# Patient Record
Sex: Male | Born: 1955 | Race: White | Hispanic: No | Smoking: Never smoker
Health system: Southern US, Community
[De-identification: ages and names within clinical notes are randomized; demographics above are authoritative.]

## PROBLEM LIST (undated history)

## (undated) DIAGNOSIS — E039 Hypothyroidism, unspecified: Secondary | ICD-10-CM

## (undated) DIAGNOSIS — R569 Unspecified convulsions: Secondary | ICD-10-CM

## (undated) DIAGNOSIS — I10 Essential (primary) hypertension: Secondary | ICD-10-CM

## (undated) DIAGNOSIS — C801 Malignant (primary) neoplasm, unspecified: Secondary | ICD-10-CM

## (undated) DIAGNOSIS — J45909 Unspecified asthma, uncomplicated: Secondary | ICD-10-CM

## (undated) DIAGNOSIS — M1990 Unspecified osteoarthritis, unspecified site: Secondary | ICD-10-CM

## (undated) DIAGNOSIS — E119 Type 2 diabetes mellitus without complications: Secondary | ICD-10-CM

## (undated) HISTORY — PX: BRAIN SURGERY: SHX531

## (undated) HISTORY — DX: Type 2 diabetes mellitus without complications: E11.9

---

## 1998-05-19 ENCOUNTER — Encounter: Payer: Self-pay | Admitting: Neurosurgery

## 1998-05-19 ENCOUNTER — Ambulatory Visit (HOSPITAL_COMMUNITY): Admission: RE | Admit: 1998-05-19 | Discharge: 1998-05-19 | Payer: Self-pay | Admitting: Neurosurgery

## 2000-04-02 ENCOUNTER — Emergency Department (HOSPITAL_COMMUNITY): Admission: EM | Admit: 2000-04-02 | Discharge: 2000-04-02 | Payer: Self-pay | Admitting: Emergency Medicine

## 2002-10-21 ENCOUNTER — Emergency Department (HOSPITAL_COMMUNITY): Admission: EM | Admit: 2002-10-21 | Discharge: 2002-10-22 | Payer: Self-pay | Admitting: *Deleted

## 2003-06-25 ENCOUNTER — Encounter: Admission: RE | Admit: 2003-06-25 | Discharge: 2003-09-23 | Payer: Self-pay | Admitting: *Deleted

## 2003-09-24 ENCOUNTER — Encounter: Admission: RE | Admit: 2003-09-24 | Discharge: 2003-09-29 | Payer: Self-pay | Admitting: *Deleted

## 2011-09-20 ENCOUNTER — Encounter (HOSPITAL_COMMUNITY): Payer: Self-pay | Admitting: *Deleted

## 2011-09-20 ENCOUNTER — Emergency Department (HOSPITAL_COMMUNITY)
Admission: EM | Admit: 2011-09-20 | Discharge: 2011-09-20 | Disposition: A | Discharge status: Home / Self Care | Payer: Medicare Other | Attending: Emergency Medicine | Admitting: Emergency Medicine

## 2011-09-20 DIAGNOSIS — S01119A Laceration without foreign body of unspecified eyelid and periocular area, initial encounter: Secondary | ICD-10-CM | POA: Insufficient documentation

## 2011-09-20 DIAGNOSIS — S01111A Laceration without foreign body of right eyelid and periocular area, initial encounter: Secondary | ICD-10-CM

## 2011-09-20 DIAGNOSIS — I10 Essential (primary) hypertension: Secondary | ICD-10-CM | POA: Insufficient documentation

## 2011-09-20 DIAGNOSIS — S0501XA Injury of conjunctiva and corneal abrasion without foreign body, right eye, initial encounter: Secondary | ICD-10-CM

## 2011-09-20 DIAGNOSIS — S058X9A Other injuries of unspecified eye and orbit, initial encounter: Secondary | ICD-10-CM | POA: Insufficient documentation

## 2011-09-20 DIAGNOSIS — IMO0002 Reserved for concepts with insufficient information to code with codable children: Secondary | ICD-10-CM | POA: Insufficient documentation

## 2011-09-20 HISTORY — DX: Unspecified convulsions: R56.9

## 2011-09-20 HISTORY — DX: Essential (primary) hypertension: I10

## 2011-09-20 MED ORDER — OFLOXACIN 0.3 % OP SOLN
1.0000 [drp] | Freq: Four times a day (QID) | OPHTHALMIC | Status: DC
  Filled 2011-09-20: qty 5

## 2011-09-20 MED ORDER — PROPARACAINE HCL 0.5 % OP SOLN
1.0000 [drp] | OPHTHALMIC | Status: DC
  Filled 2011-09-20: qty 15

## 2011-09-20 MED ORDER — FLUORESCEIN SODIUM 1 MG OP STRP
1.0000 | ORAL_STRIP | Freq: Once | OPHTHALMIC | Status: AC
  Administered 2011-09-20: 1 via OPHTHALMIC
  Filled 2011-09-20: qty 1

## 2011-09-20 MED ORDER — TETANUS-DIPHTH-ACELL PERTUSSIS 5-2.5-18.5 LF-MCG/0.5 IM SUSP
0.5000 mL | Freq: Once | INTRAMUSCULAR | Status: AC
  Administered 2011-09-20: 0.5 mL via INTRAMUSCULAR
  Filled 2011-09-20: qty 0.5
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
## 2011-09-20 NOTE — ED Notes (Signed)
Called Pharmacy for eye drops. Informed that it should be sent shortly.

## 2011-09-20 NOTE — Discharge Instructions (Signed)
Corneal Abrasion The cornea is the clear covering at the front and center of the eye. It is a thin tissue made up of layers. The top layer is the most sensitive layer. A corneal abrasion happens if this layer is scratched or an injury causes it to come off.  HOME CARE  You may be given drops or a medicated cream. Use the medicine as told by your doctor.   A pressure patch may be put over the eye. If this is done, follow your doctor's instructions for when to remove the patch. Do not drive or use machines while the eye patch is on. Judging distances is hard to do with a patch on.   See your doctor for a follow-up exam if you are told to do so.  GET HELP RIGHT AWAY IF:   The pain is getting worse or is very bad.   The eye is very sensitive to light.   Any liquid comes out of the injured eye after treatment.   Your vision suddenly gets worse.   You have a sudden loss of vision or blindness.  MAKE SURE YOU:   Understand these instructions.   Will watch your condition.   Will get help right away if you are not doing well or get worse.  Document Released: 12/27/2007 Document Revised: 03/22/2011 Document Reviewed: 12/27/2007 [facility] Patient Information 2012 [place], [place].       Tissue Adhesive Wound Care A wound can be repaired by using tissue adhesive. Tissue adhesive holds the skin together and allows faster healing. It forms a strong bond on the skin in about 1 minute and reaches its full strength in about 2 or 3 minutes. The adhesive disappears naturally while healing. Follow up is required if your caregiver wants to recheck for infection and to make sure your wound is healing properly.  You may need a tetanus shot if:  You cannot remember when you had your last tetanus shot.   You have never had a tetanus shot.   The injury broke your skin.  If you got a tetanus shot, your arm may swell, get red, and feel warm to the touch. This is common and not a problem. If you need a  tetanus shot and you choose not to have one, there is a rare chance of getting tetanus. Sickness from tetanus can be serious. HOME CARE INSTRUCTIONS   Only take over-the-counter or prescription medicines for pain, discomfort, or fever as directed by your caregiver.   Showers are allowed. Do not soak the area containing the tissue adhesive. Do not take baths, swim, or use hot tubs. Do not use any soaps or ointments on the wound until it has healed.   If a bandage (dressing) has been applied, follow your caregiver's instructions for how often to change the dressing.   Keep the dressing dry if one has been applied.   Do not scratch, pick, or rub the adhesive.   Do not place tape over the adhesive. The adhesive could come off when pulling the tape off.   Protect the wound from further injury until it is healed.   Protect the wound from sun and tanning bed exposure while it is healing and for several weeks after healing.   Keep all follow-up appointments as directed by your caregiver.  SEEK IMMEDIATE MEDICAL CARE IF:   Your wound becomes red, swollen, hot, or tender.   You have increasing pain in the wound.   You have a red streak that  goes away from the wound.   You have pus coming from the wound.   You have a fever.   You have shaking chills.   There is a bad smell coming from the wound.   The wound or adhesive breaks open.  MAKE SURE YOU:   Understand these instructions.   Will watch your condition.   Will get help right away if you are not doing well or get worse.  Document Released: 01/03/2001 Document Revised: 03/23/2011 Document Reviewed: 11/13/2010 [facility] Patient Information 2012 [place], [place].        Eye Patch There are many reasons for you to wear an eye patch, and different eye patches for each reason. PROTECTION If your eye has been injured or has undergone surgery:   Your eye may be vulnerable to infection or greater injury, until it heals.    After surgery, your doctor may want you to wear an eye patch, to prevent your eye from getting infected or wet, which increases the chance of infection.   After surgery, if your eye needed stitches (sutures) to close an incision, a patch may be needed to prevent infection. A patch also prevents the possibility that the sutures might come apart, from something touching or rubbing the eye.  Do not drive or operate machinery while wearing a patch. Remember that having one eye covered eliminates your depth perception and your ability to judge distances. TYPES OF PATCHES Hard shell patches: Many eye specialists like to be extra careful, and will have you use a hard shell covering over a patch. Or they will have you use the hard shell by itself, over your eye, if they feel it is safe for your eye to be open. This type of patch adds extra protection from any blow to the eye area. Pressure patches: A pressure patch is used in specific situations. For example, it is used when the doctor wants the surface of the clear covering at the front of the eye (cornea), to heal rapidly. The patch stops any interference from the normal blinking of the eye. The pressure patch prevents blinking, allowing the cornea surface to heal.  A pressure patch is usually thick, and taped in a special way to your cheek and forehead, so that constant pressure is applied to your eye. It must be put on properly, to avoid too much pressure. Too much pressure can damage the eye. Too little pressure allows the eyelid to move under the patch. Cosmetic patches: People may use patches for cosmetic reasons, to hide an unsightly or absent eye. SEEK IMMEDIATE MEDICAL CARE IF:  You have increased eye pain.   You develop a discharge from your eye, that is clear and watery or thick in consistency.   Your pressure patch loosens up, and needs to be replaced.  Document Released: 04/01/2004 Document Revised: 03/22/2011 Document Reviewed:  05/28/2009 [facility] Patient Information 2012 [place], [place].

## 2011-09-20 NOTE — ED Provider Notes (Signed)
See prior note   Ward Givens, MD 09/20/11 4098
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
## 2012-08-14 ENCOUNTER — Emergency Department (HOSPITAL_COMMUNITY): Payer: Medicare Other

## 2012-08-14 ENCOUNTER — Encounter (HOSPITAL_COMMUNITY): Payer: Self-pay | Admitting: *Deleted

## 2012-08-14 ENCOUNTER — Emergency Department (HOSPITAL_COMMUNITY)
Admission: EM | Admit: 2012-08-14 | Discharge: 2012-08-14 | Disposition: A | Discharge status: Home / Self Care | Payer: Medicare Other | Attending: Emergency Medicine | Admitting: Emergency Medicine

## 2012-08-14 DIAGNOSIS — T182XXA Foreign body in stomach, initial encounter: Secondary | ICD-10-CM | POA: Insufficient documentation

## 2012-08-14 DIAGNOSIS — G40909 Epilepsy, unspecified, not intractable, without status epilepticus: Secondary | ICD-10-CM | POA: Insufficient documentation

## 2012-08-14 DIAGNOSIS — IMO0002 Reserved for concepts with insufficient information to code with codable children: Secondary | ICD-10-CM | POA: Insufficient documentation

## 2012-08-14 DIAGNOSIS — Y9289 Other specified places as the place of occurrence of the external cause: Secondary | ICD-10-CM | POA: Insufficient documentation

## 2012-08-14 DIAGNOSIS — I10 Essential (primary) hypertension: Secondary | ICD-10-CM | POA: Insufficient documentation

## 2012-08-14 DIAGNOSIS — Y9389 Activity, other specified: Secondary | ICD-10-CM | POA: Insufficient documentation

## 2012-08-14 DIAGNOSIS — Z79899 Other long term (current) drug therapy: Secondary | ICD-10-CM | POA: Insufficient documentation

## 2012-08-14 DIAGNOSIS — J029 Acute pharyngitis, unspecified: Secondary | ICD-10-CM | POA: Insufficient documentation

## 2012-08-14 NOTE — ED Provider Notes (Signed)
History     CSN: [id number]  Arrival date & time 08/14/12  2039   First MD Initiated Contact with Patient 08/14/12 2053      Chief Complaint  Patient presents with  . Swallowed Foreign Body    (Consider location/radiation/quality/duration/timing/severity/associated sxs/prior treatment) HPI Comments: George Rojas is a 57 year old male patient with no past pertinent medical history who presents to the emergency department complaining of possibly swallowing a metal christmas ornament hook this evening.  He states he was watching TV and had poured some cheetos into a bowl and was eating them when he noticed he had a metal ornament hook in his mouth.  He pulled the hook out of his mouth and at this time his wife stated she thinks there were a couple of the hooks in the bowl.  He is unsure if he swallowed one of them.  Associated symptoms include minor pain on the right side of his throat.  He denies chest pain, neck pain, shortness of breath, lightheadedness, dizziness, coughing, difficulty swallowing, nausea, vomiting, diarrhea and abdominal pain.    Patient is a 57 y.o. male presenting with foreign body swallowed.  Swallowed Foreign Body Associated symptoms include a sore throat. Pertinent negatives include no abdominal pain, chest pain, chills, coughing, fever, nausea, neck pain or vomiting.    Past Medical History  Diagnosis Date  . Hypertension   . Seizures     Past Surgical History  Procedure Date  . Brain surgery     History reviewed. No pertinent family history.  History  Substance Use Topics  . Smoking status: Not on file  . Smokeless tobacco: Not on file  . Alcohol Use: No      Review of Systems  Constitutional: Negative for fever and chills.  HENT: Positive for sore throat. Negative for drooling, mouth sores, trouble swallowing, neck pain and voice change.   Respiratory: Negative for cough, choking, chest tightness and shortness of breath.   Cardiovascular:  Negative for chest pain.  Gastrointestinal: Negative for nausea, vomiting, abdominal pain and diarrhea.  Neurological: Negative for dizziness, syncope and light-headedness.  All other systems reviewed and are negative.    Allergies  Phenobarbital and Prednisone  Home Medications   Current Outpatient Rx  Name  Route  Sig  Dispense  Refill  . CARBAMAZEPINE 200 MG PO TABS   Oral   Take 200 mg by mouth 3 (three) times daily.         . ENALAPRIL MALEATE 20 MG PO TABS   Oral   Take 20 mg by mouth daily.         Marland Kitchen HYDROCHLOROTHIAZIDE 25 MG PO TABS   Oral   Take 25 mg by mouth daily.         Marland Kitchen LAMOTRIGINE 200 MG PO TABS   Oral   Take 200-400 mg by mouth 2 (two) times daily. One tablet in the am, and 2 tablets in the pm.           BP 159/97  Pulse 96  Temp 98.1 F (36.7 C) (Oral)  Resp 16  SpO2 96%  Physical Exam  Constitutional: He is oriented to person, place, and time. He appears well-developed and well-nourished. No distress.  HENT:  Head: Normocephalic and atraumatic.  Mouth/Throat: Oropharynx is clear and moist. No oropharyngeal exudate.       No pharyngeal erythema noted.    Cardiovascular: Normal rate, regular rhythm and normal heart sounds.   Pulmonary/Chest: Effort normal and  breath sounds normal. No respiratory distress. He has no wheezes. He has no rales. He exhibits no tenderness.  Abdominal: Soft. There is no tenderness. There is no rebound and no guarding.  Neurological: He is alert and oriented to person, place, and time.  Skin: Skin is warm and dry. He is not diaphoretic.    ED Course  Procedures (including critical care time)  Labs Reviewed - No data to display Dg Chest 1 View  08/14/2012  *RADIOLOGY REPORT*  Clinical Data: The patient swallowed foreign body.  CHEST - 1 VIEW  Comparison: None.  Findings: Leads from prior stimulator device are seen over the left chest.  No unexpected radiopaque foreign body is identified.  Lungs are clear.   Heart size is normal.  No pneumothorax or pleural fluid.  IMPRESSION: Negative for foreign body.   Original Report Authenticated By: Holley Dexter, M.D.    Dg Abd 1 View  08/14/2012  *RADIOLOGY REPORT*  Clinical Data: The patient swallowed a foreign body.  ABDOMEN - 1 VIEW  Comparison: None.  Findings: There is a radiopaque foreign body projecting over the distal aspect of the stomach.  The examination is otherwise negative.  IMPRESSION: Radiopaque foreign body projects over the distal stomach.   Original Report Authenticated By: Holley Dexter, M.D.      1. Foreign body in stomach       MDM  Will obtain 1 view chest and abdomen to include neck   ornament visualized in the stomach       Arman Filter, NP 08/14/12 2147

## 2012-08-14 NOTE — ED Notes (Addendum)
Was eating chips from a bowl. Was informed that there were christmas tree ornament hangers/hooks (similar to a paper clip) in the bowl. Now not sure if he swallowed one of them. States, "not sure whether I am just paranoid and anxious". Alert, NAD, calm, interactive, resps e/u, speech clear, speaking in clear complete sentences. LS CTA. Throat unremarkable. Oral cavity clear, no obvious injury or blood. No coughing noted. Denies sx.

## 2012-08-14 NOTE — ED Notes (Signed)
Patient transported to X-ray 

## 2012-08-18 NOTE — ED Provider Notes (Signed)
Medical screening examination/treatment/procedure(s) were performed by non-physician practitioner and as supervising physician I was immediately available for consultation/collaboration.  Tobin Chad, MD 08/18/12 [phone]

## 2014-08-04 ENCOUNTER — Encounter: Payer: Medicare Other | Attending: Family Medicine

## 2014-08-04 VITALS — Ht 71.0 in | Wt 215.8 lb

## 2014-08-04 DIAGNOSIS — IMO0002 Reserved for concepts with insufficient information to code with codable children: Secondary | ICD-10-CM

## 2014-08-04 DIAGNOSIS — E118 Type 2 diabetes mellitus with unspecified complications: Secondary | ICD-10-CM | POA: Diagnosis not present

## 2014-08-04 DIAGNOSIS — Z713 Dietary counseling and surveillance: Secondary | ICD-10-CM | POA: Diagnosis not present

## 2014-08-04 DIAGNOSIS — E1165 Type 2 diabetes mellitus with hyperglycemia: Secondary | ICD-10-CM

## 2014-08-04 NOTE — Progress Notes (Signed)

## 2014-08-11 DIAGNOSIS — E118 Type 2 diabetes mellitus with unspecified complications: Secondary | ICD-10-CM | POA: Diagnosis not present

## 2014-08-11 DIAGNOSIS — E119 Type 2 diabetes mellitus without complications: Secondary | ICD-10-CM

## 2014-08-11 NOTE — Progress Notes (Signed)

## 2014-08-18 DIAGNOSIS — E119 Type 2 diabetes mellitus without complications: Secondary | ICD-10-CM

## 2014-08-18 DIAGNOSIS — E118 Type 2 diabetes mellitus with unspecified complications: Secondary | ICD-10-CM | POA: Diagnosis not present

## 2014-08-18 NOTE — Progress Notes (Signed)
Patient was seen on 08/18/14 for the third of a series of three diabetes self-management courses at the [facility]. The following learning objectives were met by the patient during this class:  . State the amount of activity recommended for healthy living . Describe activities suitable for individual needs . Identify ways to regularly incorporate activity into daily life . Identify barriers to activity and ways to over come these barriers  Identify diabetes medications being personally used and their primary action for lowering glucose and possible side effects . Describe role of stress on blood glucose and develop strategies to address psychosocial issues . Identify diabetes complications and ways to prevent them  Explain how to manage diabetes during illness . Evaluate success in meeting personal goal . Establish 2-3 goals that they will plan to diligently work on until they return for the  64-monthfollow-up visit  Goals:   I will count my carb choices at most meals and snacks  I will be more active   I will take my diabetes medications as scheduled  I will test my glucose at least 2 times a day, 5 days a week  To help manage stress I will call friends at least 2 times a week  Your patient has identified these potential barriers to change:  Lack of Family Support  Your patient has identified their diabetes self-care support plan as  [facility] Family Support Plan:  Attend Core 4 in 4 months

## 2015-04-27 ENCOUNTER — Encounter (HOSPITAL_COMMUNITY): Payer: Self-pay | Admitting: Emergency Medicine

## 2015-04-27 ENCOUNTER — Emergency Department (HOSPITAL_COMMUNITY)
Admission: EM | Admit: 2015-04-27 | Discharge: 2015-04-27 | Disposition: A | Discharge status: Home / Self Care | Payer: Medicare Other | Attending: Emergency Medicine | Admitting: Emergency Medicine

## 2015-04-27 ENCOUNTER — Emergency Department (HOSPITAL_COMMUNITY): Payer: Medicare Other

## 2015-04-27 DIAGNOSIS — Z79899 Other long term (current) drug therapy: Secondary | ICD-10-CM | POA: Insufficient documentation

## 2015-04-27 DIAGNOSIS — E119 Type 2 diabetes mellitus without complications: Secondary | ICD-10-CM | POA: Insufficient documentation

## 2015-04-27 DIAGNOSIS — J45901 Unspecified asthma with (acute) exacerbation: Secondary | ICD-10-CM | POA: Diagnosis not present

## 2015-04-27 DIAGNOSIS — R05 Cough: Secondary | ICD-10-CM | POA: Diagnosis present

## 2015-04-27 DIAGNOSIS — I10 Essential (primary) hypertension: Secondary | ICD-10-CM | POA: Diagnosis not present

## 2015-04-27 DIAGNOSIS — Z7982 Long term (current) use of aspirin: Secondary | ICD-10-CM | POA: Diagnosis not present

## 2015-04-27 HISTORY — DX: Unspecified asthma, uncomplicated: J45.909

## 2015-04-27 MED ORDER — ALBUTEROL SULFATE HFA 108 (90 BASE) MCG/ACT IN AERS: 1.0000 | INHALATION_SPRAY | Freq: Four times a day (QID) | RESPIRATORY_TRACT | Status: AC | PRN

## 2015-04-27 MED ORDER — DEXAMETHASONE SODIUM PHOSPHATE 10 MG/ML IJ SOLN
4.0000 mg | Freq: Once | INTRAMUSCULAR | Status: AC
  Administered 2015-04-27: 4 mg via INTRAMUSCULAR
  Filled 2015-04-27: qty 1

## 2015-04-27 MED ORDER — PREDNISONE 20 MG PO TABS: ORAL_TABLET | ORAL | Status: DC

## 2015-04-27 MED ORDER — IPRATROPIUM BROMIDE 0.02 % IN SOLN
0.5000 mg | Freq: Once | RESPIRATORY_TRACT | Status: AC
  Administered 2015-04-27: 0.5 mg via RESPIRATORY_TRACT
  Filled 2015-04-27: qty 2.5

## 2015-04-27 MED ORDER — ALBUTEROL SULFATE (2.5 MG/3ML) 0.083% IN NEBU
5.0000 mg | INHALATION_SOLUTION | Freq: Once | RESPIRATORY_TRACT | Status: AC
  Administered 2015-04-27: 5 mg via RESPIRATORY_TRACT
  Filled 2015-04-27: qty 6

## 2015-04-27 MED ORDER — LORATADINE 10 MG PO TABS
10.0000 mg | ORAL_TABLET | Freq: Once | ORAL | Status: AC
  Administered 2015-04-27: 10 mg via ORAL
  Filled 2015-04-27: qty 1

## 2015-04-27 MED ORDER — CETIRIZINE HCL 10 MG PO TABS: 10.0000 mg | ORAL_TABLET | Freq: Every day | ORAL | Status: DC

## 2015-04-27 NOTE — ED Notes (Signed)
Awaiting paperwork for discharge.

## 2015-04-27 NOTE — ED Provider Notes (Signed)
CSN: [id number]     Arrival date & time 04/27/15  0033 History  By signing my name below, I, Tanda Rockers, attest that this documentation has been prepared under the direction and in the presence of Handsome Anglin, MD. Electronically Signed: Tanda Rockers, ED Scribe. 04/27/2015. 12:56 AM.    Chief Complaint  Patient presents with  . Cough   Patient is a 59 y.o. male presenting with cough. The history is provided by the patient. No language interpreter was used.  Cough Cough characteristics:  Non-productive Severity:  Moderate Onset quality:  Gradual Duration:  5 days Timing:  Constant Progression:  Unchanged Chronicity:  Recurrent Smoker: no   Context: weather changes   Relieved by:  Nothing Worsened by:  Nothing tried Ineffective treatments:  Beta-agonist inhaler (Inhaler) Associated symptoms: wheezing   Associated symptoms: no chest pain, no chills, no fever, no shortness of breath and no sore throat      HPI Comments: George Rojas is a 59 y.o. male with hx asthma, HTN, and DM who presents to the Emergency Department complaining of gradual onset, intermittent, cough, wheezing, and congestion x 5 days. Pt has been using his inhaler without relief. Pt states these symptoms feel similar to his usual asthma exacerbation. He states that these symptoms usual occur when the weather changes. Denies fever, chills, or any other associated symptoms. Pt is non smoker.   Past Medical History  Diagnosis Date  . Hypertension   . Seizures (HCC)   . Diabetes mellitus without complication (HCC)   . Asthma    Past Surgical History  Procedure Laterality Date  . Brain surgery     No family history on file. Social History  Substance Use Topics  . Smoking status: Never Smoker   . Smokeless tobacco: None  . Alcohol Use: No    Review of Systems  Constitutional: Negative for fever and chills.  HENT: Positive for congestion. Negative for sore throat and voice change.   Respiratory:  Positive for cough and wheezing. Negative for shortness of breath.   Cardiovascular: Negative for chest pain.  All other systems reviewed and are negative.  Allergies  Phenobarbital and Prednisone  Home Medications   Prior to Admission medications   Medication Sig Start Date End Date Taking? Authorizing Provider  aspirin 81 MG tablet Take 81 mg by mouth daily.    Historical Provider, MD  atorvastatin (LIPITOR) 10 MG tablet Take 10 mg by mouth daily.    Historical Provider, MD  carbamazepine (TEGRETOL) 200 MG tablet Take 200 mg by mouth 3 (three) times daily.    Historical Provider, MD  enalapril (VASOTEC) 20 MG tablet Take 20 mg by mouth daily.    Historical Provider, MD  hydrochlorothiazide (HYDRODIURIL) 25 MG tablet Take 25 mg by mouth daily.    Historical Provider, MD  lamoTRIgine (LAMICTAL) 200 MG tablet Take 200-400 mg by mouth 2 (two) times daily. One tablet in the am, and 2 tablets in the pm.    Historical Provider, MD  LORazepam (ATIVAN) 0.5 MG tablet Take 0.5 mg by mouth every 8 (eight) hours as needed for anxiety.    Historical Provider, MD  metFORMIN (GLUCOPHAGE) 500 MG tablet Take by mouth 2 (two) times daily with a meal.    Historical Provider, MD  terazosin (HYTRIN) 1 MG capsule Take 1 mg by mouth at bedtime.    Historical Provider, MD   Triage VItals: BP 137/77 mmHg  Pulse 105  Temp(Src) 98.4 F (36.9 C) (Oral)  Resp 26  SpO2 93%   Physical Exam  Constitutional: He is oriented to person, place, and time. He appears well-developed and well-nourished. No distress.  HENT:  Head: Normocephalic and atraumatic.  Mouth/Throat: Oropharynx is clear and moist and mucous membranes are normal. No oropharyngeal exudate.  No swelling of the lips, tongue, or uvula  Eyes: Conjunctivae and EOM are normal. Pupils are equal, round, and reactive to light.  Neck: Normal range of motion. Neck supple. No tracheal deviation present.  Trachea is midline no pain with displacement of the  trachea  Cardiovascular: Normal rate and intact distal pulses.   Pulmonary/Chest: Effort normal. No stridor. No respiratory distress. He has wheezes. He has no rales.  Decreased breath sounds  Abdominal: Soft. Bowel sounds are normal. He exhibits no mass. There is no tenderness. There is no rebound and no guarding.  Musculoskeletal: Normal range of motion.  Neurological: He is alert and oriented to person, place, and time.  Skin: Skin is warm and dry.  Psychiatric: He has a normal mood and affect. His behavior is normal.  Nursing note and vitals reviewed.   ED Course  Procedures (including critical care time)  DIAGNOSTIC STUDIES: Oxygen Saturation is 93% on RA, low by my interpretation.    COORDINATION OF CARE: 12:54 AM-Discussed treatment plan which includes breathing treatment with pt at bedside and pt agreed to plan.   Labs Review Labs Reviewed - No data to display  Imaging Review No results found. I have personally reviewed and evaluated these images and lab results as part of my medical decision-making.   EKG Interpretation None      MDM   Final diagnoses:  None   Medications  albuterol (PROVENTIL) (2.5 MG/3ML) 0.083% nebulizer solution 5 mg (5 mg Nebulization Given 04/27/15 0057)  ipratropium (ATROVENT) nebulizer solution 0.5 mg (0.5 mg Nebulization Given 04/27/15 0057)  dexamethasone (DECADRON) injection 4 mg (4 mg Intramuscular Given 04/27/15 0108)  loratadine (CLARITIN) tablet 10 mg (10 mg Oral Given 04/27/15 0108)  albuterol (PROVENTIL) (2.5 MG/3ML) 0.083% nebulizer solution 5 mg (5 mg Nebulization Given 04/27/15 0252)   Clear post second nebulizer treatment, normal O2 saturation.  Will start steroids and have patient follow up with his pmd today.Return for any new or worsening symptoms  I, Amry Cathy-RASCH,Chancy Claros K, personally performed the services described in this documentation. All medical record entries made by the scribe were at my direction and in my presence.   I have reviewed the chart and discharge instructions and agree that the record reflects my personal performance and is accurate and complete. Cherylyn Sundby-RASCH,Airabella Barley K.  04/27/2015. 3:19 AM.       Ayaansh Smail, MD 04/27/15 1610

## 2015-04-27 NOTE — ED Notes (Signed)
Pt states he started getting sick on Thursday with congestion and cough  Pt states it has been getting worse since  Pt has hx of asthma  Pt states has used his inhaler without relief  Dry hacking cough noted in triage with audible wheezing noted

## 2015-04-27 NOTE — ED Notes (Signed)
No adverse reaction to decadron at this time

## 2015-04-27 NOTE — ED Notes (Signed)
MD at bedside at this time.

## 2015-04-27 NOTE — ED Notes (Signed)
Pt given discharge instructions and verbalized understanding of need to follow up, reasons to return to the ED and medications to take at home. Pt denies pain. VSS. Pt requested to speak to MD about questions. Dr. Nicanor Alcon has spoken to pt. Pt ambulated to exit, moving all extremities, without difficulty.

## 2015-04-27 NOTE — ED Notes (Signed)
Pt adds he has been running a fever today

## 2015-04-27 NOTE — Discharge Instructions (Signed)

## 2015-09-16 ENCOUNTER — Emergency Department (HOSPITAL_COMMUNITY): Payer: Medicare Other

## 2015-09-16 ENCOUNTER — Encounter (HOSPITAL_COMMUNITY): Payer: Self-pay

## 2015-09-16 ENCOUNTER — Emergency Department (HOSPITAL_COMMUNITY)
Admission: EM | Admit: 2015-09-16 | Discharge: 2015-09-16 | Disposition: A | Discharge status: Home / Self Care | Payer: Medicare Other | Attending: Emergency Medicine | Admitting: Emergency Medicine

## 2015-09-16 DIAGNOSIS — Z79899 Other long term (current) drug therapy: Secondary | ICD-10-CM | POA: Diagnosis not present

## 2015-09-16 DIAGNOSIS — J4520 Mild intermittent asthma, uncomplicated: Secondary | ICD-10-CM

## 2015-09-16 DIAGNOSIS — E119 Type 2 diabetes mellitus without complications: Secondary | ICD-10-CM | POA: Diagnosis not present

## 2015-09-16 DIAGNOSIS — Z7982 Long term (current) use of aspirin: Secondary | ICD-10-CM | POA: Diagnosis not present

## 2015-09-16 DIAGNOSIS — I10 Essential (primary) hypertension: Secondary | ICD-10-CM | POA: Diagnosis not present

## 2015-09-16 DIAGNOSIS — Z7984 Long term (current) use of oral hypoglycemic drugs: Secondary | ICD-10-CM | POA: Diagnosis not present

## 2015-09-16 DIAGNOSIS — J45909 Unspecified asthma, uncomplicated: Secondary | ICD-10-CM | POA: Diagnosis present

## 2015-09-16 LAB — COMPREHENSIVE METABOLIC PANEL
ALBUMIN: 4.5 g/dL (ref 3.5–5.0)
ALT: 25 U/L (ref 17–63)
AST: 24 U/L (ref 15–41)
Alkaline Phosphatase: 74 U/L (ref 38–126)
Anion gap: 14 (ref 5–15)
BUN: 15 mg/dL (ref 6–20)
CHLORIDE: 101 mmol/L (ref 101–111)
CO2: 24 mmol/L (ref 22–32)
CREATININE: 1.09 mg/dL (ref 0.61–1.24)
Calcium: 9.4 mg/dL (ref 8.9–10.3)
GFR calc Af Amer: >60 mL/min (ref >60)
GLUCOSE: 264 mg/dL — AB (ref 65–99)
POTASSIUM: 3.1 mmol/L — AB (ref 3.5–5.1)
Sodium: 139 mmol/L (ref 135–145)
Total Bilirubin: 0.6 mg/dL (ref 0.3–1.2)
Total Protein: 7.7 g/dL (ref 6.5–8.1)

## 2015-09-16 LAB — CBC WITH DIFFERENTIAL/PLATELET
BASOS ABS: 0 10*3/uL (ref 0.0–0.1)
BASOS PCT: 0 %
EOS PCT: 1 %
Eosinophils Absolute: 0.1 10*3/uL (ref 0.0–0.7)
HEMATOCRIT: 44.3 % (ref 39.0–52.0)
Hemoglobin: 15.1 g/dL (ref 13.0–17.0)
LYMPHS PCT: 19 %
Lymphs Abs: 1.3 10*3/uL (ref 0.7–4.0)
MCH: 31.2 pg (ref 26.0–34.0)
MCHC: 34.1 g/dL (ref 30.0–36.0)
MCV: 91.5 fL (ref 78.0–100.0)
Monocytes Absolute: 0.4 10*3/uL (ref 0.1–1.0)
Monocytes Relative: 6 %
NEUTROS ABS: 5 10*3/uL (ref 1.7–7.7)
Neutrophils Relative %: 74 %
PLATELETS: 225 10*3/uL (ref 150–400)
RBC: 4.84 MIL/uL (ref 4.22–5.81)
RDW: 13.2 % (ref 11.5–15.5)
WBC: 6.8 10*3/uL (ref 4.0–10.5)

## 2015-09-16 LAB — CBG MONITORING, ED: GLUCOSE-CAPILLARY: 224 mg/dL — AB (ref 65–99)

## 2015-09-16 MED ORDER — ALBUTEROL SULFATE (2.5 MG/3ML) 0.083% IN NEBU
5.0000 mg | INHALATION_SOLUTION | Freq: Once | RESPIRATORY_TRACT | Status: AC
  Administered 2015-09-16: 5 mg via RESPIRATORY_TRACT
  Filled 2015-09-16: qty 6

## 2015-09-16 MED ORDER — ALBUTEROL SULFATE (2.5 MG/3ML) 0.083% IN NEBU
2.5000 mg | INHALATION_SOLUTION | Freq: Once | RESPIRATORY_TRACT | Status: AC
  Administered 2015-09-16: 2.5 mg via RESPIRATORY_TRACT
  Filled 2015-09-16: qty 3

## 2015-09-16 MED ORDER — ALBUTEROL SULFATE (2.5 MG/3ML) 0.083% IN NEBU
INHALATION_SOLUTION | RESPIRATORY_TRACT | Status: AC
  Filled 2015-09-16: qty 6

## 2015-09-16 MED ORDER — IPRATROPIUM-ALBUTEROL 0.5-2.5 (3) MG/3ML IN SOLN
3.0000 mL | Freq: Once | RESPIRATORY_TRACT | Status: AC
  Administered 2015-09-16: 3 mL via RESPIRATORY_TRACT
  Filled 2015-09-16: qty 3

## 2015-09-16 MED ORDER — METHYLPREDNISOLONE SODIUM SUCC 125 MG IJ SOLR
125.0000 mg | Freq: Once | INTRAMUSCULAR | Status: AC
  Administered 2015-09-16: 125 mg via INTRAVENOUS
  Filled 2015-09-16: qty 2

## 2015-09-16 MED ORDER — SODIUM CHLORIDE 0.9 % IV BOLUS (SEPSIS)
500.0000 mL | Freq: Once | INTRAVENOUS | Status: AC
  Administered 2015-09-16: 500 mL via INTRAVENOUS

## 2015-09-16 MED ORDER — AZITHROMYCIN 250 MG PO TABS: ORAL_TABLET | ORAL | Status: DC

## 2015-09-16 NOTE — ED Notes (Signed)
RN to start IV, will obtain blood at that time

## 2015-09-16 NOTE — ED Notes (Signed)
Pt ambulated to the BR with steady gait.   

## 2015-09-16 NOTE — ED Provider Notes (Signed)
CSN: [id number]     Arrival date & time 09/16/15  7829 History   First MD Initiated Contact with Patient 09/16/15 0703     Chief Complaint  Patient presents with  . Asthma     (Consider location/radiation/quality/duration/timing/severity/associated sxs/prior Treatment) Patient is a 60 y.o. male presenting with asthma. The history is provided by the patient (Patient complains of shortness of breath and wheezing. He has been on prednisone recently).  Asthma This is a recurrent problem. The current episode started 3 to 5 hours ago. The problem occurs constantly. Pertinent negatives include no chest pain, no abdominal pain and no headaches. Nothing aggravates the symptoms. Nothing relieves the symptoms.    Past Medical History  Diagnosis Date  . Hypertension   . Seizures (HCC)   . Diabetes mellitus without complication (HCC)   . Asthma    Past Surgical History  Procedure Laterality Date  . Brain surgery     History reviewed. No pertinent family history. Social History  Substance Use Topics  . Smoking status: Never Smoker   . Smokeless tobacco: None  . Alcohol Use: No    Review of Systems  Constitutional: Negative for appetite change and fatigue.  HENT: Negative for congestion, ear discharge and sinus pressure.   Eyes: Negative for discharge.  Respiratory: Positive for wheezing. Negative for cough.   Cardiovascular: Negative for chest pain.  Gastrointestinal: Negative for abdominal pain and diarrhea.  Genitourinary: Negative for frequency and hematuria.  Musculoskeletal: Negative for back pain.  Skin: Negative for rash.  Neurological: Negative for seizures and headaches.  Psychiatric/Behavioral: Negative for hallucinations.      Allergies  Phenobarbital  Home Medications   Prior to Admission medications   Medication Sig Start Date End Date Taking? Authorizing Provider  albuterol (PROVENTIL HFA;VENTOLIN HFA) 108 (90 BASE) MCG/ACT inhaler Inhale 1-2 puffs into the  lungs every 6 (six) hours as needed for wheezing or shortness of breath. 04/27/15  Yes April Palumbo, MD  aspirin EC 81 MG tablet Take 81 mg by mouth at bedtime.    Yes Historical Provider, MD  atorvastatin (LIPITOR) 10 MG tablet Take 10 mg by mouth at bedtime.    Yes Historical Provider, MD  carbamazepine (TEGRETOL) 200 MG tablet Take 200 mg by mouth 2 (two) times daily.    Yes Historical Provider, MD  cetirizine (ZYRTEC ALLERGY) 10 MG tablet Take 1 tablet (10 mg total) by mouth daily. 04/27/15  Yes April Palumbo, MD  dextromethorphan-guaiFENesin [facility] DM) 30-600 MG 12hr tablet Take 2 tablets by mouth once.   Yes Historical Provider, MD  enalapril (VASOTEC) 20 MG tablet Take 20 mg by mouth daily with breakfast.    Yes Historical Provider, MD  hydrochlorothiazide (HYDRODIURIL) 25 MG tablet Take 25 mg by mouth daily with breakfast.    Yes Historical Provider, MD  lamoTRIgine (LAMICTAL) 200 MG tablet Take 200-400 mg by mouth 2 (two) times daily. One tablet in the am, and 2 tablets in the pm.   Yes Historical Provider, MD  LORazepam (ATIVAN) 0.5 MG tablet Take 0.5 mg by mouth every 8 (eight) hours as needed for anxiety.   Yes Historical Provider, MD  metFORMIN (GLUCOPHAGE) 500 MG tablet Take 500 mg by mouth 2 (two) times daily with a meal.    Yes Historical Provider, MD  predniSONE (DELTASONE) 20 MG tablet 3 tabs po day one, then 2 po daily x 4 days Patient taking differently: Take 40 mg by mouth every morning. Started 02/23 for 5 days 04/27/15  Yes April Palumbo, MD  terazosin (HYTRIN) 1 MG capsule Take 1 mg by mouth at bedtime.   Yes Historical Provider, MD  azithromycin (ZITHROMAX Z-PAK) 250 MG tablet 2 po day one, then 1 daily x 4 days 09/16/15   Bethann Berkshire, MD   BP 127/62 mmHg  Pulse 112  Temp(Src) 98.3 F (36.8 C) (Oral)  Resp 17  SpO2 96% Physical Exam  Constitutional: He is oriented to person, place, and time. He appears well-developed.  HENT:  Head: Normocephalic.  Eyes:  Conjunctivae and EOM are normal. No scleral icterus.  Neck: Neck supple. No thyromegaly present.  Cardiovascular: Normal rate and regular rhythm.  Exam reveals no gallop and no friction rub.   No murmur heard. Pulmonary/Chest: No stridor. He has wheezes. He has no rales. He exhibits no tenderness.  Abdominal: He exhibits no distension. There is no tenderness. There is no rebound.  Musculoskeletal: Normal range of motion. He exhibits no edema.  Lymphadenopathy:    He has no cervical adenopathy.  Neurological: He is oriented to person, place, and time. He exhibits normal muscle tone. Coordination normal.  Skin: No rash noted. No erythema.  Psychiatric: He has a normal mood and affect. His behavior is normal.    ED Course  Procedures (including critical care time) Labs Review Labs Reviewed  COMPREHENSIVE METABOLIC PANEL - Abnormal; Notable for the following:    Potassium 3.1 (*)    Glucose, Bld 264 (*)    All other components within normal limits  CBG MONITORING, ED - Abnormal; Notable for the following:    Glucose-Capillary 224 (*)    All other components within normal limits  CBC WITH DIFFERENTIAL/PLATELET    Imaging Review Dg Chest 2 View  09/16/2015  CLINICAL DATA:  Worsening cough.  Shortness of breath over 2-3 days. EXAM: CHEST  2 VIEW COMPARISON:  04/27/2015 FINDINGS: Both lungs are clear. Heart and mediastinum are within normal limits. The trachea is midline. Again noted are leads on the left side of the chest and neck. No acute bone abnormality. IMPRESSION: No active cardiopulmonary disease. Electronically Signed   By: Richarda Overlie M.D.   On: 09/16/2015 07:58   I have personally reviewed and evaluated these images and lab results as part of my medical decision-making.   EKG Interpretation None      MDM   Final diagnoses:  Asthma, mild intermittent, uncomplicated    Patient with asthmatic bronchitis. Presently on prednisone. Also albuterol inhaler but he only uses it  once a day. Patient was instructed to use the inhaler at least 4 times a day for the next couple days is also given a prescription for Z-Pak and he'll follow-up with his PCP    Bethann Berkshire, MD 09/16/15 1022

## 2015-09-16 NOTE — ED Notes (Signed)
Patient transported to X-ray 

## 2015-09-16 NOTE — ED Notes (Signed)
Pt complains of an asthma attack that started when he woke up this am, he was placed on prednisone this week in [facility] for a cough and his blood sugars have been high.

## 2015-09-16 NOTE — Discharge Instructions (Signed)
Follow up with your md next week or sooner

## 2016-05-15 ENCOUNTER — Ambulatory Visit: Payer: Medicare Other | Admitting: Family Medicine

## 2017-03-09 IMAGING — DX DG CHEST 1V PORT
1 series · 1 of 1 positions shown · non-contrast
Comparison: 08/14/2012

CLINICAL DATA: Cough congestion and dyspnea for 4 days.

EXAM:
PORTABLE CHEST 1 VIEW

[chest ap]
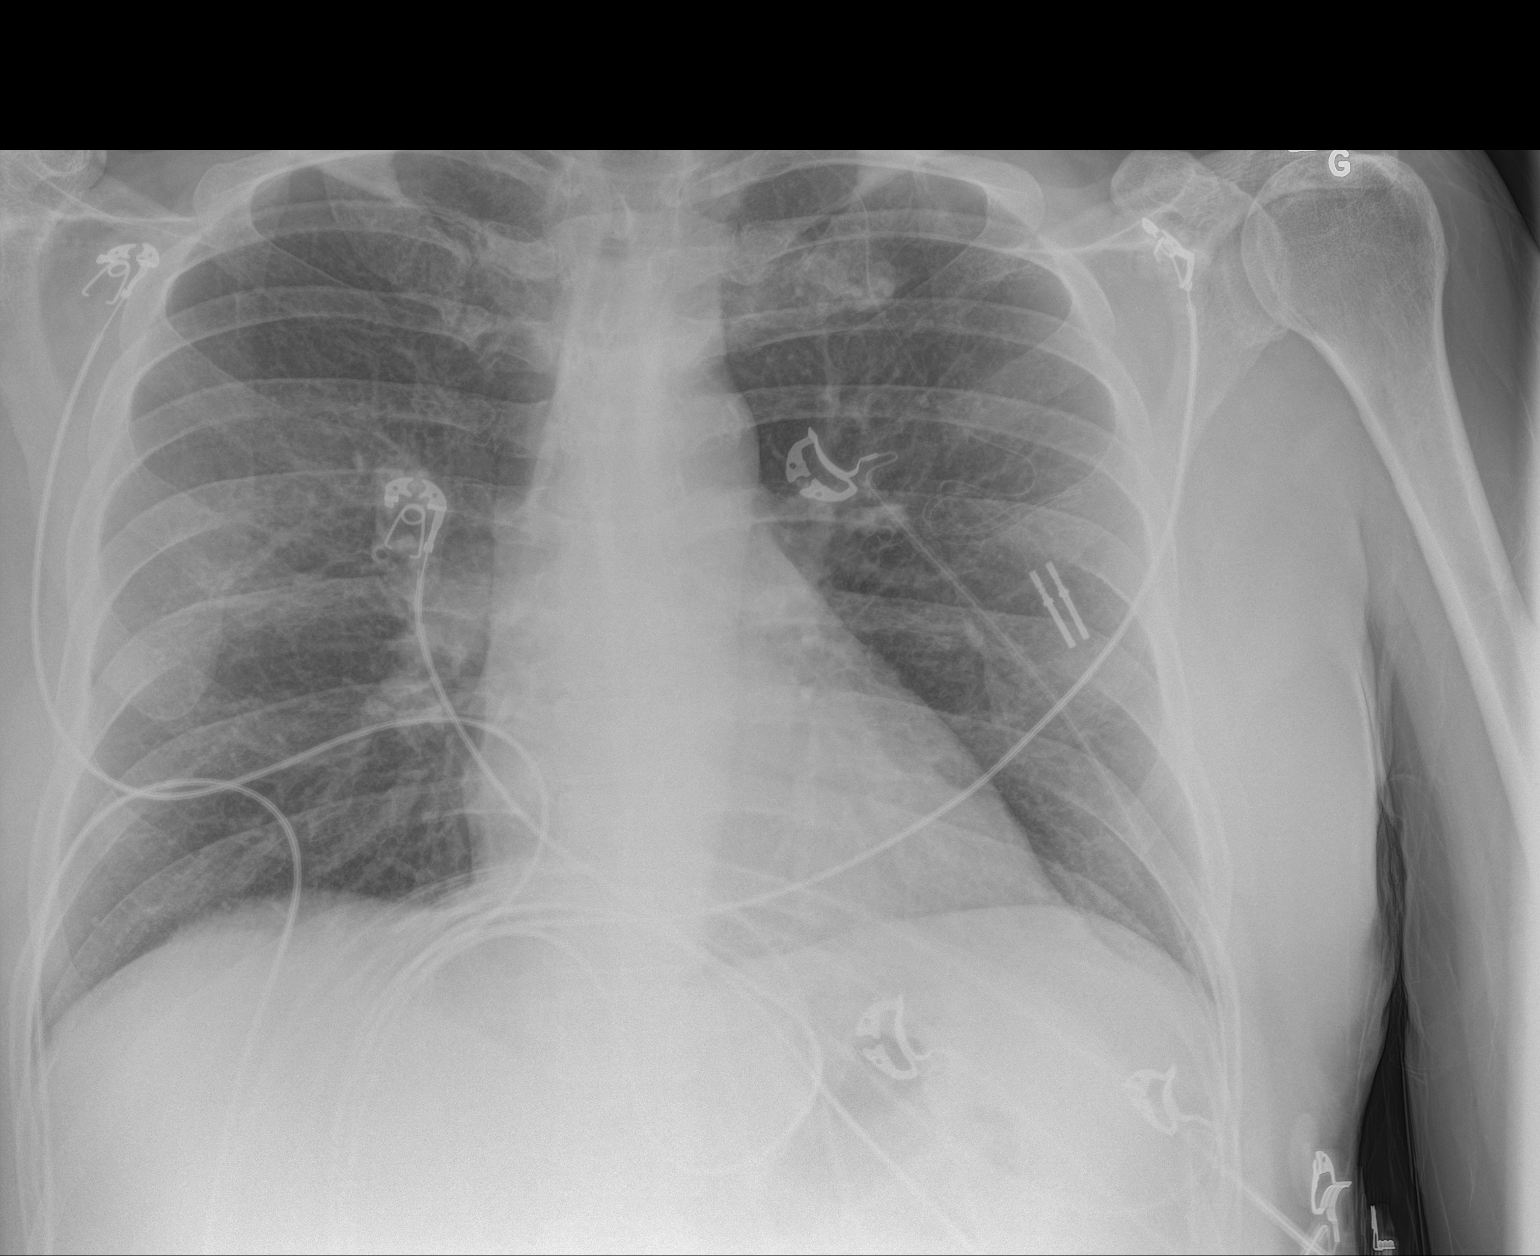

[1 of 1 positions shown; findings below may reference images not displayed]

FINDINGS: A single AP portable view of the chest demonstrates no focal
airspace consolidation or alveolar edema. The lungs are grossly
clear. There is no large effusion or pneumothorax. Cardiac and
mediastinal contours appear unremarkable.
IMPRESSION: No active disease.

## 2018-05-13 ENCOUNTER — Encounter: Payer: Medicare HMO | Attending: Family Medicine | Admitting: Dietician

## 2018-05-13 ENCOUNTER — Encounter: Payer: Self-pay | Admitting: Dietician

## 2018-05-13 DIAGNOSIS — E119 Type 2 diabetes mellitus without complications: Secondary | ICD-10-CM

## 2018-05-13 DIAGNOSIS — Z713 Dietary counseling and surveillance: Secondary | ICD-10-CM | POA: Insufficient documentation

## 2018-05-13 DIAGNOSIS — E118 Type 2 diabetes mellitus with unspecified complications: Secondary | ICD-10-CM | POA: Diagnosis not present

## 2018-05-13 NOTE — Patient Instructions (Addendum)
Great job staying active! Great job on stopping sweet drinks Consider changing to [employer] and be a spreader not a globber.  Increase your vegetable intake- half your plate Choose lean meat  Foods eaten out are often high in sodium.  Mindfulness:  Eat slowly and stop when you are satisfied  Don't snack if you are not hungry.     Resources: Temple Pacini- Food Network Diabetes food hub Diabetes.org Diabetes self management recipes Diabetes Forcast recipes  Google Armchair exercises

## 2018-05-13 NOTE — Progress Notes (Signed)
Diabetes Self-Management Education  Visit Type: First/Initial  Appt. Start Time: 1050 Appt. End Time: 1205  05/14/2018  [name], identified by name and date of birth, is a 62 y.o. male with a diagnosis of Diabetes: Type 2.  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  He had a torn meniscus last November and gained weight and is just started to get back into walking. Weight was 210 lbs one year ago.  Weight today 224 lbs decreased from 226 lbs 6 weeks ago. His goal weight is less than 200 lbs.  Has started to walk again and goes to the gym.  Medications include:  Metformin  Patient is currently on disability secondary to aphagia.  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  Patient lives alone. He was drinking a 2 L bottle of regular Coke in 3-4 days and has changed to diet soda, unsweetened tea with splenda, and water.  He follows a low sodium, low fat, carb modified diet.  His cooking skills are limited.  ASSESSMENT  Height 5\' 11"  (1.803 m), weight 224 lb (101.6 kg). Body mass index is 31.24 kg/m.  Diabetes Self-Management Education - 05/13/18 1100      Visit Information   Visit Type  First/Initial      Initial Visit   Diabetes Type  Type 2    Are you currently following a meal plan?  Yes    What type of meal plan do you follow?  carb mod, low fat, low salt    Are you taking your medications as prescribed?  Yes    Date Diagnosed  2016      Health Coping   How would you rate your overall health?  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  3 - Sometimes    What is the last grade level you completed in school?  2 years college      Pre-Education Assessment   Patient understands the diabetes disease and treatment process.  Needs Review    Patient understands incorporating nutritional management into lifestyle.  Needs Review    Patient undertands incorporating physical activity into lifestyle.  Needs Review    Patient understands using medications safely.  Needs Review    Patient understands monitoring blood glucose, interpreting and using results  Needs Review    Patient understands prevention, detection, and treatment of acute complications.  Needs Review    Patient understands prevention, detection, and treatment of chronic complications.  Needs Review    Patient understands how to develop strategies to address psychosocial issues.  Needs Review    Patient understands how to develop strategies to promote health/change behavior.  Needs Review      Complications   Last HgB A1C per patient/outside source  7.2 %   9/4 increased from  6.7 one year ago   How often do you check your blood sugar?  3-4 times/day    Fasting Blood glucose range (mg/dL)  782-956;21-308    Postprandial Blood glucose range (mg/dL)  657-846    Number of hypoglycemic episodes per month  0    Number of hyperglycemic episodes per week  0    Have you had a dilated eye exam in the past 12 months?  Yes    Have you had a dental exam in the past 12 months?  Yes    Are you checking your feet?  Yes    How many days per week are you checking your feet?  1      Dietary Intake   Breakfast  cereal and 2% milk OR instant oatmeal OR eggs, bacon or sausage link, cereal or oatmeal    Snack (morning)  fruit    Lunch  chciken sandwich or pimento  cheese sandwich on [employer] with a few chips and occasional fruit    Snack (afternoon)  fruit    Dinner  soup and grilled cheese OR chicken alone or with vegetables, OR [employer] meat and vegetable    Beverage(s)  water, diet coke, unsweetened tea with splenda, sparkling water, vitamin water, 2% milk      Exercise   Exercise Type  Light (walking / raking leaves)   walks   How many days per week to you exercise?  3    How many minutes per day do you exercise?  60    Total minutes per week of exercise  180      Patient Education   Previous Diabetes Education  Yes (please comment)   3 years ago   Disease state   Explored patient's options for treatment of their diabetes    Nutrition management   Role of diet in the treatment of diabetes and the relationship between the three main macronutrients and blood glucose level;Information on hints to eating out and maintain blood glucose control.;Meal options for control of blood glucose level and chronic complications.    Physical activity and exercise   Role of exercise on diabetes management, blood pressure control and cardiac health.    Medications  Reviewed patients medication for diabetes, action, purpose, timing of dose and side effects.    Psychosocial adjustment  Worked with patient to identify barriers to care and solutions;Role of stress on diabetes;Identified and addressed patients feelings and concerns about diabetes      Individualized Goals (developed by patient)   Nutrition  General guidelines for healthy choices and portions discussed    Physical Activity  Exercise 3-5 times per week;60 minutes per day    Medications  take my medication as prescribed    Monitoring   test my blood glucose as discussed    Reducing Risk  examine blood glucose patterns;increase portions of healthy fats    Health Coping  discuss diabetes with (comment)   MD, RD, CDE     Post-Education Assessment   Patient understands the diabetes disease and treatment process.   Demonstrates understanding / competency    Patient understands incorporating nutritional management into lifestyle.  Needs Review    Patient undertands incorporating physical activity into lifestyle.  Demonstrates understanding / competency    Patient understands using medications safely.  Demonstrates understanding / competency    Patient understands monitoring blood glucose, interpreting and using results  Demonstrates understanding / competency    Patient understands prevention, detection, and treatment of acute complications.  Demonstrates understanding / competency    Patient understands prevention, detection, and treatment of chronic complications.  Demonstrates understanding / competency    Patient understands how to develop strategies to address psychosocial issues.  Needs Review    Patient understands how to develop strategies to promote health/change behavior.  Needs Review      Outcomes   Expected Outcomes  Demonstrated interest in learning. Expect positive outcomes    Future DMSE  4-6 wks    Program Status  Completed       Individualized Plan for Diabetes Self-Management Training:   Learning Objective:  Patient will have a greater understanding of diabetes self-management. Patient education plan is to attend individual and/or group sessions per assessed needs and concerns.   Plan:   Patient Instructions  [employer] job staying active! Great job on stopping sweet drinks Consider changing to [employer] and be a spreader not a globber.  Increase your vegetable intake- half your plate Choose lean meat  Foods eaten out are often high in sodium.  Mindfulness:  Eat slowly and stop when you are satisfied  Don't snack if you are not hungry.     Resources: Temple Pacini- Food Network Diabetes food hub Diabetes.org Diabetes self management recipes Diabetes Forcast recipes  Google Armchair exercises   Expected Outcomes:  Demonstrated interest in learning. Expect positive  outcomes  Education material provided: Food label handouts, Meal plan card, My Plate and Snack sheet, eating out tips  If problems or questions, patient to contact team via:  Phone  Future DSME appointment: 4-6 wks

## 2018-06-17 ENCOUNTER — Encounter: Payer: Medicare HMO | Attending: Family Medicine | Admitting: Dietician

## 2018-06-17 ENCOUNTER — Encounter: Payer: Self-pay | Admitting: Dietician

## 2018-06-17 DIAGNOSIS — E119 Type 2 diabetes mellitus without complications: Secondary | ICD-10-CM

## 2018-06-17 DIAGNOSIS — Z713 Dietary counseling and surveillance: Secondary | ICD-10-CM | POA: Insufficient documentation

## 2018-06-17 DIAGNOSIS — E118 Type 2 diabetes mellitus with unspecified complications: Secondary | ICD-10-CM | POA: Insufficient documentation

## 2018-06-17 NOTE — Patient Instructions (Addendum)
Consider getting your vitamin B-12 checked  Speak with your doctor about your symptoms of neuropathy. Consider getting your vitamin D checked  Breakfast ideas 1.  2 Nutragrain waffles with sugar free syrup 1-2 [place] sausage or other very lean sausage or egg  cup applesauce or  banana or other choice of fruit 2.  Egg and cheese on 2 slices of [employer] toast, 1-2 tsp mayo 1 orange or other choice of fruit 3.   cup cornflakes,  cup 2% milk  banana 1 slice toast with peanut butter or a slice of cheese 4.  1 pack of instant oatmeal  1 slice [employer] toast, slice of cheese or peanut butter  1 fruit serving 5.  2 eggs, 2 strips bacon 1-2 slices WW toast, 1 tsp jelly 1 fruit serving Lunch ideas 1.  1 cup soup and grilled cheese sandwich 2. [place] stew, 6 crackers 3. 1 sandwich on [employer] bread, 1-2 tsp mayo, fruit or small handful chips 4. Potato soup, salad with lean meat and or beans Supper ideas 1.  Salad bar with lean meat, fruit, 6 crackers 2. Lean steak or grilled chicken, potato with 1-2 tsp butter, salad small amount salad 3. Grilled chicken,  ear corn, 1 small sweet potato, green beans, 1-2 tsp butter 4. 1 cup spaghetti, marinara sauce, grilled chicken, broccoli, salad 5. Chicken burrito (1-2), 1/3 cup rice, salad 6. Lean burger on a bun, lettuce, tomato, onion, 1-2 tsp mayo, salad 7. 1 cup rice, broccoli, stir fried chicken

## 2018-06-19 NOTE — Progress Notes (Signed)
Diabetes Self-Management Education  Visit Type: Follow-up  Appt. Start Time: 1400 Appt. End Time:  1435  06/19/2018  [name], identified by name and date of birth, is a 62 y.o. male with a diagnosis of Diabetes:  .   ASSESSMENT Mr. George RilyFrancis Rojas, identified by name and date of birth, is a 62 y.o. male with a diagnosis of Diabetes: Type 2.  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  He had a torn meniscus last November and gained weight and is just started to get back into walking. Weight was 210 lbs one year ago.  Weight today 224 lbs decreased from 226 lbs 6 weeks ago. His goal weight is less than 200 lbs.  Has started to walk again and goes to the gym.  Medications include:  Metformin  Patient is currently on disability secondary to aphagia.  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  Patient lives alone. He was drinking a 2 L bottle of regular Coke in 3-4 days and has changed to diet soda, unsweetened tea with splenda, and water.  He follows a low sodium, low fat, carb modified diet.  His cooking skills are limited.  Since last visit, patient has decreased her sweetened beverages.  She would like to learn more about meal planning today. Weight 222 lb (100.7 kg). Body mass index is 30.96 kg/m.  Diabetes Self-Management Education - 06/19/18 0139      Visit Information   Visit Type  Follow-up      Psychosocial Assessment   Other persons present  Patient    Patient Concerns  Nutrition/Meal planning;Glycemic Control;Weight Control      Pre-Education Assessment   Patient understands the diabetes disease and treatment process.  Demonstrates understanding / competency    Patient understands incorporating nutritional management into lifestyle.  Needs Review    Patient undertands incorporating physical activity into lifestyle.  Demonstrates understanding / competency    Patient understands  using medications safely.  Demonstrates understanding / competency    Patient understands monitoring blood glucose, interpreting and using results  Demonstrates understanding / competency    Patient understands prevention, detection, and treatment of acute complications.  Demonstrates understanding / competency    Patient understands prevention, detection, and treatment of chronic complications.  Demonstrates understanding / competency    Patient understands how to develop strategies to address psychosocial issues.  Demonstrates understanding / competency    Patient understands how to develop strategies to promote health/change behavior.  Needs Review      Complications   Fasting Blood glucose range (mg/dL)  14-782;[id number];130-179   [id number]   Number of hypoglycemic episodes per month  0    Number of hyperglycemic episodes per week  0      Dietary Intake   Breakfast  cereal with milk or grits or oatmeal or eggs ww toast and bacon    Lunch  sandwich on [employer]- ham and chips    Snack (afternoon)  fruit    Dinner  soup and grilled cheese OR chicken alone or with vegetables OR out to eat    Beverage(s)  water, unsweetened tea with splenda, sparkling water, vitamin water, 2% milk      Exercise   Exercise Type  Light (walking / raking leaves)    How many days per week to you exercise?  3    How many minutes per day do you exercise?  60  Total minutes per week of exercise  180      Patient Education   Previous Diabetes Education  Yes (please comment)   1 month ago   Nutrition management   Role of diet in the treatment of diabetes and the relationship between the three main macronutrients and blood glucose level;Food label reading, portion sizes and measuring food.;Information on hints to eating out and maintain blood glucose control.;Meal options for control of blood glucose level and chronic complications.    Medications  Reviewed patients medication for diabetes, action, purpose, timing of dose and side  effects.    Monitoring  Other (comment)   reviewed results     Individualized Goals (developed by patient)   Nutrition  General guidelines for healthy choices and portions discussed    Physical Activity  Exercise 3-5 times per week;60 minutes per day    Medications  take my medication as prescribed    Monitoring   test my blood glucose as discussed    Reducing Risk  examine blood glucose patterns;increase portions of healthy fats      Patient Self-Evaluation of Goals - Patient rates self as meeting previously set goals (% of time)   Nutrition  50 - 75 %    Physical Activity  >75%    Medications  >75%    Monitoring  >75%    Problem Solving  >75%    Reducing Risk  50 - 75 %    Health Coping  >75%      Post-Education Assessment   Patient understands the diabetes disease and treatment process.  Demonstrates understanding / competency    Patient understands incorporating nutritional management into lifestyle.  Demonstrates understanding / competency    Patient undertands incorporating physical activity into lifestyle.  Demonstrates understanding / competency    Patient understands using medications safely.  Demonstrates understanding / competency    Patient understands monitoring blood glucose, interpreting and using results  Demonstrates understanding / competency    Patient understands prevention, detection, and treatment of acute complications.  Demonstrates understanding / competency    Patient understands prevention, detection, and treatment of chronic complications.  Demonstrates understanding / competency    Patient understands how to develop strategies to address psychosocial issues.  Demonstrates understanding / competency    Patient understands how to develop strategies to promote health/change behavior.  Demonstrates understanding / competency      Outcomes   Expected Outcomes  Demonstrated interest in learning. Expect positive outcomes    Future DMSE  PRN    Program Status   Completed      Subsequent Visit   Since your last visit have you experienced any weight changes?  Loss    Weight Loss (lbs)  2    Since your last visit, are you checking your blood glucose at least once a day?  Yes       Individualized Plan for Diabetes Self-Management Training:   Learning Objective:  Patient will have a greater understanding of diabetes self-management. Patient education plan is to attend individual and/or group sessions per assessed needs and concerns.   Plan:   Patient Instructions  Consider getting your vitamin B-12 checked  Speak with your doctor about your symptoms of neuropathy. Consider getting your vitamin D checked  Breakfast ideas 1.  2 Nutragrain waffles with sugar free syrup 1-2 [place] sausage or other very lean sausage or egg  cup applesauce or  banana or other choice of fruit 2.  Egg and cheese on 2  slices of [employer] toast, 1-2 tsp mayo 1 orange or other choice of fruit 3.   cup cornflakes,  cup 2% milk  banana 1 slice toast with peanut butter or a slice of cheese 4.  1 pack of instant oatmeal  1 slice [employer] toast, slice of cheese or peanut butter  1 fruit serving 5.  2 eggs, 2 strips bacon 1-2 slices WW toast, 1 tsp jelly 1 fruit serving Lunch ideas 1.  1 cup soup and grilled cheese sandwich 2. [place] stew, 6 crackers 3. 1 sandwich on [employer] bread, 1-2 tsp mayo, fruit or small handful chips 4. Potato soup, salad with lean meat and or beans Supper ideas 1.  Salad bar with lean meat, fruit, 6 crackers 2. Lean steak or grilled chicken, potato with 1-2 tsp butter, salad small amount salad 3. Grilled chicken,  ear corn, 1 small sweet potato, green beans, 1-2 tsp butter 4. 1 cup spaghetti, marinara sauce, grilled chicken, broccoli, salad 5. Chicken burrito (1-2), 1/3 cup rice, salad 6. Lean burger on a bun, lettuce, tomato, onion, 1-2 tsp mayo, salad 7. 1 cup rice, broccoli, stir fried chicken     Expected Outcomes:  Demonstrated interest  in learning. Expect positive outcomes  Education material provided:   If problems or questions, patient to contact team via:  Phone  Future DSME appointment: PRN

## 2018-07-30 ENCOUNTER — Encounter: Payer: Medicare HMO | Attending: Family Medicine | Admitting: Dietician

## 2018-07-30 DIAGNOSIS — E119 Type 2 diabetes mellitus without complications: Secondary | ICD-10-CM

## 2018-07-30 DIAGNOSIS — Z713 Dietary counseling and surveillance: Secondary | ICD-10-CM | POA: Diagnosis not present

## 2018-07-30 DIAGNOSIS — E118 Type 2 diabetes mellitus with unspecified complications: Secondary | ICD-10-CM | POA: Diagnosis present

## 2018-07-30 NOTE — Patient Instructions (Signed)
Consider getting your vitamin B-12 checked             Speak with your doctor about your symptoms of neuropathy. Consider getting your vitamin D checked  Google for recipe ideas  Sheet pan meal recipes  Diabetic crockpot recipies LUVO frozen meal  Continue to lean to cook at home more often. Continue to be mindful about portions. Continue to be mindful about the amount of sodium in foods.  Aim for about 45 grams of carbohydrates per meal

## 2018-08-01 ENCOUNTER — Encounter: Payer: Self-pay | Admitting: Dietician

## 2018-08-01 NOTE — Progress Notes (Signed)
Diabetes Self-Management Education  Visit Type: Follow-up  Appt. Start Time: 1400 Appt. End Time: 1430  08/01/2018  [name], identified by name and date of birth, is a 63 y.o. 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 He had a torn meniscus last November.  Weight was 210 lbs one year ago. Weight today 221 lbs decreased from 226 lbs 6 weeks ago. His goal weight is less than 200 lbs.   Medications include: Metformin  Patient is currently on disability secondary to aphagia. 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 He was drinking a 2 L bottle of regular Coke in 3-4 days and has changed to diet soda, unsweetened tea with splenda, and water. He follows a low sodium, low fat, carb modified diet. His cooking skills are limited.  Since last visit, patient has been learning to cook more.  He finds it difficult to do so at times and does not enjoy this often but enjoys the simple meals that he has prepared in a crock pot.  He states that he is pleased with decreasing fasting blood sugar readings.  We worked further on meal planning and finding healthy recipes today.  He had stopped walking and going to the gym.  Encouraged ways to resume walking and time at the gym.  He is having some neuropathy.  Weight 221 lb (100.2 kg). Body mass index is 30.82 kg/m.  Diabetes Self-Management Education - 08/01/18 1600      Visit Information   Visit Type  Follow-up      Psychosocial Assessment   Patient Belief/Attitude about Diabetes  Motivated to manage diabetes    Self-care barriers  Other (comment)    Other persons present  Patient    Patient Concerns  Nutrition/Meal planning      Pre-Education Assessment   Patient understands the diabetes disease and treatment process.  Demonstrates understanding / competency    Patient understands incorporating nutritional management into lifestyle.  Needs Review    Patient undertands incorporating physical activity into lifestyle.  Needs Review    Patient understands using medications safely.  Demonstrates understanding / competency    Patient understands monitoring blood glucose, interpreting and using results  Demonstrates understanding / competency    Patient understands prevention, detection, and treatment of acute complications.  Demonstrates understanding / competency    Patient understands prevention, detection, and treatment of chronic complications.  Demonstrates understanding / competency    Patient understands how to develop strategies to address psychosocial issues.  Demonstrates understanding / competency    Patient understands how to develop strategies to promote health/change behavior.  Needs Review      Complications   How often do you check your blood sugar?  3-4 times/day    Fasting Blood glucose range (mg/dL)  38-937;342-876    Postprandial Blood glucose range (mg/dL)  81-157;262-035    Number of hypoglycemic episodes per month  0    Number of hyperglycemic episodes per week  0      Patient Education   Previous Diabetes Education  Yes (please comment)   2 months ago   Nutrition management   Meal options for control of blood glucose level and chronic complications.    Physical activity and exercise   Identified with patient nutritional and/or medication changes necessary with exercise.    Medications  Reviewed patients medication for diabetes, action, purpose, timing of dose and side effects.    Monitoring  Identified appropriate SMBG and/or A1C goals.    Psychosocial adjustment  Worked with patient to identify barriers to care and solutions;Identified and addressed patients feelings and concerns about diabetes      Individualized Goals (developed by patient)   Nutrition  General guidelines for healthy choices and portions discussed    Physical  Activity  Exercise 5-7 days per week    Medications  take my medication as prescribed    Monitoring   test my blood glucose as discussed    Reducing Risk  examine blood glucose patterns    Health Coping  discuss diabetes with (comment)      Patient Self-Evaluation of Goals - Patient rates self as meeting previously set goals (% of time)   Nutrition  >75%    Physical Activity  25 - 50%    Medications  >75%    Monitoring  >75%    Problem Solving  >75%    Reducing Risk  >75%    Health Coping  >75%      Post-Education Assessment   Patient understands the diabetes disease and treatment process.  Demonstrates understanding / competency    Patient understands incorporating nutritional management into lifestyle.  Needs Review    Patient undertands incorporating physical activity into lifestyle.  Needs Review    Patient understands using medications safely.  Demonstrates understanding / competency    Patient understands monitoring blood glucose, interpreting and using results  Demonstrates understanding / competency    Patient understands prevention, detection, and treatment of acute complications.  Demonstrates understanding / competency    Patient understands prevention, detection, and treatment of chronic complications.  Demonstrates understanding / competency    Patient understands how to develop strategies to address psychosocial issues.  Demonstrates understanding / competency    Patient understands how to develop strategies to promote health/change behavior.  Demonstrates understanding / competency      Outcomes   Expected Outcomes  Demonstrated interest in learning. Expect positive outcomes    Future DMSE  PRN    Program Status  Completed      Subsequent Visit   Since your last visit have you experienced any weight changes?  Loss    Weight Loss (lbs)  1    Since your last visit, are you checking your blood glucose at least once a day?  Yes       Individualized Plan for Diabetes  Self-Management Training:   Learning Objective:  Patient will have a greater understanding of diabetes self-management. Patient education plan is to attend individual and/or group sessions per assessed needs and concerns.   Plan:   Patient Instructions  Consider getting your vitamin B-12 checked             Speak with your doctor about your symptoms of neuropathy. Consider getting your vitamin D checked  Google for recipe ideas  Sheet pan meal recipes  Diabetic crockpot recipies LUVO frozen meal  Continue to lean to cook at home more often. Continue to be mindful about portions. Continue to be mindful about the amount of sodium in foods.  Aim for about 45 grams of carbohydrates per meal    Expected Outcomes:  Demonstrated interest in learning. Expect positive outcomes  Education material provided:   If problems or questions, patient to contact team via:  Phone  Future DSME appointment: PRN

## 2018-10-01 ENCOUNTER — Encounter: Payer: Self-pay | Admitting: Dietician

## 2018-10-01 ENCOUNTER — Encounter: Payer: Medicare HMO | Attending: Family Medicine | Admitting: Dietician

## 2018-10-01 DIAGNOSIS — E118 Type 2 diabetes mellitus with unspecified complications: Secondary | ICD-10-CM | POA: Insufficient documentation

## 2018-10-01 DIAGNOSIS — Z713 Dietary counseling and surveillance: Secondary | ICD-10-CM | POA: Insufficient documentation

## 2018-10-01 DIAGNOSIS — E119 Type 2 diabetes mellitus without complications: Secondary | ICD-10-CM

## 2018-10-01 NOTE — Patient Instructions (Addendum)
[email or web address] Continue your mindfulness about your diet. Continue to stay more active.  Aim for 30 minutes most days. Continue to check your blood sugar.  Consider varying the times and checking 2 hours after you eat at times.

## 2018-10-01 NOTE — Progress Notes (Signed)
Diabetes Self-Management Education  Visit Type:  Follow-up  Appt. Start Time: 1410 Appt. End Time: 1440  10/01/2018  [name], identified by name and date of birth, is a 63 y.o. male with a diagnosis of Diabetes:  Type 2  .   ASSESSMENT Patient is here today alone for follow up of type 2 diabetes and weight control.  He has continued to learn to cook more.  He is spending more time with a friend who is a Engineer, civil (consulting) and is very supportive. History includes removal of a mass in the Left Temporal Lobe which effects understanding at times.  He had a torn meniscus last November.   Medications include Metformin.   BG 86-104 fasting with occasional 150 Post meal BG rare 277 and usually 130-180 based on BG meter review.  Patient is currently on disability secondary to aphagia. 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 He was drinking a 2 L bottle of regular Coke in 3-4 days and has changed to diet soda, unsweetened tea with splenda, and water. He follows a low sodium, low fat, carb modified diet. His cooking skills are limited.  Weight decreased from 221 lbs in the past 2 months and 6 lbs loss sinc October.  He has been trying to lose weight but has not felt deprived.     Weight 218 lb (98.9 kg). Body mass index is 30.4 kg/m.   Diabetes Self-Management Education - 10/01/18 1500      Psychosocial Assessment   Patient Belief/Attitude about Diabetes  Motivated to manage diabetes    Self-care barriers  Other (comment)   brain injury   Patient Concerns  Nutrition/Meal planning;Glycemic Control;Weight Control    Special Needs  Simplified materials    Preferred Learning Style  No preference indicated    Learning Readiness  Ready      Pre-Education Assessment   Patient understands the diabetes disease and treatment process.  Demonstrates understanding / competency    Patient understands incorporating nutritional management into lifestyle.  Needs  Review    Patient undertands incorporating physical activity into lifestyle.  Needs Review    Patient understands using medications safely.  Demonstrates understanding / competency    Patient understands monitoring blood glucose, interpreting and using results  Demonstrates understanding / competency    Patient understands prevention, detection, and treatment of acute complications.  Demonstrates understanding / competency    Patient understands prevention, detection, and treatment of chronic complications.  Demonstrates understanding / competency    Patient understands how to develop strategies to address psychosocial issues.  Demonstrates understanding / competency    Patient understands how to develop strategies to promote health/change behavior.  Demonstrates understanding / competency      Complications   How often do you check your blood sugar?  1-2 times/day    Fasting Blood glucose range (mg/dL)  53-976    Postprandial Blood glucose range (mg/dL)  734-193    Number of hypoglycemic episodes per month  0    Number of hyperglycemic episodes per week  1    Can you tell when your blood sugar is high?  No      Dietary Intake   Breakfast  cereal (frosted flakes), milk, banana or oatmeal, banana    Snack (morning)  occasional fruit    Lunch  sandwich on rye, handful of chips    Dinner  soup or stew or meat, starch, vegetable    Beverage(s)  water,  unsweetened tea, [place] water, vitamin water, 2% milk      Exercise   Exercise Type  Light (walking / raking leaves)    How many days per week to you exercise?  3    How many minutes per day do you exercise?  60    Total minutes per week of exercise  180      Patient Education   Previous Diabetes Education  Yes (please comment)   2 months ago   Nutrition management   Meal options for control of blood glucose level and chronic complications.    Physical activity and exercise   Helped patient identify appropriate exercises in relation to  his/her diabetes, diabetes complications and other health issue.    Monitoring  Purpose and frequency of SMBG.;Identified appropriate SMBG and/or A1C goals.    Psychosocial adjustment  Worked with patient to identify barriers to care and solutions      Individualized Goals (developed by patient)   Nutrition  General guidelines for healthy choices and portions discussed    Physical Activity  Exercise 5-7 days per week;30 minutes per day    Medications  take my medication as prescribed    Monitoring   test my blood glucose as discussed    Reducing Risk  examine blood glucose patterns    Health Coping  discuss diabetes with (comment)      Patient Self-Evaluation of Goals - Patient rates self as meeting previously set goals (% of time)   Nutrition  >75%    Physical Activity  50 - 75 %    Medications  >75%    Monitoring  >75%    Problem Solving  >75%    Reducing Risk  >75%    Health Coping  >75%      Post-Education Assessment   Patient understands the diabetes disease and treatment process.  Demonstrates understanding / competency    Patient understands incorporating nutritional management into lifestyle.  Needs Review    Patient undertands incorporating physical activity into lifestyle.  Needs Review    Patient understands using medications safely.  Demonstrates understanding / competency    Patient understands monitoring blood glucose, interpreting and using results  Demonstrates understanding / competency    Patient understands prevention, detection, and treatment of acute complications.  Demonstrates understanding / competency    Patient understands prevention, detection, and treatment of chronic complications.  Demonstrates understanding / competency    Patient understands how to develop strategies to address psychosocial issues.  Demonstrates understanding / competency    Patient understands how to develop strategies to promote health/change behavior.  Demonstrates understanding /  competency      Outcomes   Program Status  Completed      Subsequent Visit   Since your last visit have you experienced any weight changes?  Loss    Weight Loss (lbs)  3    Since your last visit, are you checking your blood glucose at least once a day?  Yes       Learning Objective:  Patient will have a greater understanding of diabetes self-management. Patient education plan is to attend individual and/or group sessions per assessed needs and concerns.   Plan:   Patient Instructions  [email or web address] Continue your mindfulness about your diet. Continue to stay more active.  Aim for 30 minutes most days. Continue to check your blood sugar.  Consider varying the times and checking 2 hours after you eat at times.    Expected Outcomes:  Demonstrated  interest in learning. Expect positive outcomes  Education material provided: How to Thrive:  A Guide for Your Journey with Diabetes, Diabetes Resources page  If problems or questions, patient to contact team via:  Phone  Future DSME appointment: - 2 months

## 2018-12-03 ENCOUNTER — Ambulatory Visit: Payer: Medicare HMO | Admitting: Dietician

## 2019-06-16 ENCOUNTER — Other Ambulatory Visit: Payer: Self-pay

## 2019-06-16 DIAGNOSIS — Z20822 Contact with and (suspected) exposure to covid-19: Secondary | ICD-10-CM

## 2019-06-17 LAB — NOVEL CORONAVIRUS, NAA: SARS-CoV-2, NAA: NOT DETECTED

## 2019-06-18 ENCOUNTER — Telehealth: Payer: Self-pay | Admitting: *Deleted

## 2019-06-18 NOTE — Telephone Encounter (Signed)
Patient called and was given negative results . 

## 2019-10-03 ENCOUNTER — Ambulatory Visit: Payer: Medicare HMO

## 2019-10-04 ENCOUNTER — Ambulatory Visit: Payer: Medicare HMO | Attending: Internal Medicine

## 2019-10-04 DIAGNOSIS — Z23 Encounter for immunization: Secondary | ICD-10-CM

## 2019-10-04 NOTE — Progress Notes (Signed)
   Covid-19 Vaccination Clinic  Name:  George Rojas    MRN: [id number] DOB: 04/23/1956  10/04/2019  [name] was observed post Covid-19 immunization for 30 minutes based on pre-vaccination screening without incident. He was provided with Vaccine Information Sheet and instruction to access the V-Safe system.   [name] was instructed to call 911 with any severe reactions post vaccine: Marland Kitchen Difficulty breathing  . Swelling of face and throat  . A fast heartbeat  . A bad rash all over body  . Dizziness and weakness   Immunizations Administered    Name Date Dose VIS Date Route   Pfizer COVID-19 Vaccine 10/04/2019 10:02 AM 0.3 mL 07/04/2019 Intramuscular   Manufacturer: [employer], [employer]   Lot: [id number]   NDC: [address]-0

## 2019-10-29 ENCOUNTER — Ambulatory Visit: Payer: Medicare HMO | Attending: Internal Medicine

## 2019-10-29 DIAGNOSIS — Z23 Encounter for immunization: Secondary | ICD-10-CM

## 2019-10-29 NOTE — Progress Notes (Signed)
   Covid-19 Vaccination Clinic  Name:  George Rojas    MRN: [id number] DOB: Feb 07, 1956  10/29/2019  [name] was observed post Covid-19 immunization for 15 minutes without incident. He was provided with Vaccine Information Sheet and instruction to access the V-Safe system.   [name] was instructed to call 911 with any severe reactions post vaccine: Marland Kitchen Difficulty breathing  . Swelling of face and throat  . A fast heartbeat  . A bad rash all over body  . Dizziness and weakness   Immunizations Administered    Name Date Dose VIS Date Route   Pfizer COVID-19 Vaccine 10/29/2019  1:27 PM 0.3 mL 07/04/2019 Intramuscular   Manufacturer: [employer], [employer]   Lot: [id number]   NDC: [address]-3

## 2022-12-30 ENCOUNTER — Encounter (HOSPITAL_BASED_OUTPATIENT_CLINIC_OR_DEPARTMENT_OTHER): Payer: Self-pay | Admitting: Emergency Medicine

## 2022-12-30 ENCOUNTER — Emergency Department (HOSPITAL_BASED_OUTPATIENT_CLINIC_OR_DEPARTMENT_OTHER)
Admission: EM | Admit: 2022-12-30 | Discharge: 2022-12-30 | Disposition: A | Discharge status: Home / Self Care | Payer: Medicare HMO | Attending: Emergency Medicine | Admitting: Emergency Medicine

## 2022-12-30 DIAGNOSIS — J45909 Unspecified asthma, uncomplicated: Secondary | ICD-10-CM | POA: Insufficient documentation

## 2022-12-30 DIAGNOSIS — I10 Essential (primary) hypertension: Secondary | ICD-10-CM | POA: Insufficient documentation

## 2022-12-30 DIAGNOSIS — Z7982 Long term (current) use of aspirin: Secondary | ICD-10-CM | POA: Diagnosis not present

## 2022-12-30 DIAGNOSIS — Z7984 Long term (current) use of oral hypoglycemic drugs: Secondary | ICD-10-CM | POA: Insufficient documentation

## 2022-12-30 DIAGNOSIS — E119 Type 2 diabetes mellitus without complications: Secondary | ICD-10-CM | POA: Insufficient documentation

## 2022-12-30 DIAGNOSIS — Z79899 Other long term (current) drug therapy: Secondary | ICD-10-CM | POA: Insufficient documentation

## 2022-12-30 DIAGNOSIS — R21 Rash and other nonspecific skin eruption: Secondary | ICD-10-CM | POA: Diagnosis present

## 2022-12-30 DIAGNOSIS — L247 Irritant contact dermatitis due to plants, except food: Secondary | ICD-10-CM | POA: Insufficient documentation

## 2022-12-30 MED ORDER — METHYLPREDNISOLONE 4 MG PO TBPK: ORAL_TABLET | ORAL | 0 refills | Status: DC

## 2022-12-30 NOTE — ED Notes (Signed)
RN reviewed discharge instructions with pt. Pt verbalized understanding and had no further questions. VSS upon discharge.  

## 2022-12-30 NOTE — ED Triage Notes (Signed)
Exposure to poison ivy or poison oak 2 days ago. Rash to arms, neck, and head. Also reports hoarse voice.

## 2022-12-30 NOTE — ED Provider Notes (Signed)
[place] EMERGENCY DEPARTMENT AT [facility] Provider Note  CSN: [id number] Arrival date & time: 12/30/22 2040  Chief Complaint(s) Rash  HPI George Rojas is a 67 y.o. male with h/p DM II here with rash for 2 days.    Rash Location:  Face, shoulder/arm, finger, torso, head/neck and pelvis Severity:  Moderate Onset quality:  Gradual Duration:  2 days Timing:  Constant Progression:  Spreading Chronicity:  New Context: plant contact   Relieved by:  Nothing   Past Medical History Past Medical History:  Diagnosis Date   Asthma    Diabetes mellitus without complication (HCC)    Hypertension    Seizures (HCC)    There are no problems to display for this patient.  Home Medication(s) Prior to Admission medications   Medication Sig Start Date End Date Taking? Authorizing Provider  methylPREDNISolone (MEDROL DOSEPAK) 4 MG TBPK tablet Use as directed on the package 12/30/22  Yes Victor Granados, Amadeo Garnet, MD  albuterol (PROVENTIL HFA;VENTOLIN HFA) 108 (90 BASE) MCG/ACT inhaler Inhale 1-2 puffs into the lungs every 6 (six) hours as needed for wheezing or shortness of breath. 04/27/15   Palumbo, April, MD  aspirin EC 81 MG tablet Take 81 mg by mouth at bedtime.     [provider]  atorvastatin (LIPITOR) 10 MG tablet Take 10 mg by mouth at bedtime.     [provider]  azithromycin (ZITHROMAX Z-PAK) 250 MG tablet 2 po day one, then 1 daily x 4 days Patient not taking: Reported on 05/13/2018 09/16/15   Bethann Berkshire, MD  carbamazepine (TEGRETOL) 200 MG tablet Take 200 mg by mouth 2 (two) times daily.     [provider]  cetirizine (ZYRTEC ALLERGY) 10 MG tablet Take 1 tablet (10 mg total) by mouth daily. 04/27/15   Palumbo, April, MD  Cinnamon 500 MG TABS Take by mouth.    [provider]  dextromethorphan-guaiFENesin (MUCINEX DM) 30-600 MG 12hr tablet Take 2 tablets by mouth once.    [provider]  enalapril (VASOTEC) 20 MG tablet  Take 20 mg by mouth daily with breakfast.     [provider]  hydrochlorothiazide (HYDRODIURIL) 25 MG tablet Take 25 mg by mouth daily with breakfast.     [provider]  lamoTRIgine (LAMICTAL) 200 MG tablet Take 200-400 mg by mouth 2 (two) times daily. One tablet in the am, and 2 tablets in the pm.    [provider]  LORazepam (ATIVAN) 0.5 MG tablet Take 0.5 mg by mouth every 8 (eight) hours as needed for anxiety.    [provider]  metFORMIN (GLUCOPHAGE) 500 MG tablet Take 500 mg by mouth 2 (two) times daily with a meal.     [provider]  predniSONE (DELTASONE) 20 MG tablet 3 tabs po day one, then 2 po daily x 4 days Patient not taking: Reported on 05/13/2018 04/27/15   Palumbo, April, MD  terazosin (HYTRIN) 1 MG capsule Take 1 mg by mouth at bedtime.    [provider]  Allergies Phenobarbital  Review of Systems Review of Systems  Skin:  Positive for rash.   As noted in HPI  Physical Exam Vital Signs  I have reviewed the triage vital signs BP (!) 164/92   Pulse 62   Temp 98.2 F (36.8 C) (Oral)   Resp 18   Ht 5\' 11"  (1.803 m)   Wt 99.8 kg   SpO2 100%   BMI 30.68 kg/m   Physical Exam Vitals reviewed.  Constitutional:      General: He is not in acute distress.    Appearance: He is well-developed. He is not diaphoretic.  HENT:     Head: Normocephalic and atraumatic.     Right Ear: External ear normal.     Left Ear: External ear normal.     Nose: Nose normal.     Mouth/Throat:     Mouth: Mucous membranes are moist.  Eyes:     General: No scleral icterus.    Conjunctiva/sclera: Conjunctivae normal.  Neck:     Trachea: Phonation normal.  Cardiovascular:     Rate and Rhythm: Normal rate and regular rhythm.  Pulmonary:     Effort: Pulmonary effort is normal. No respiratory distress.      Breath sounds: No stridor.  Abdominal:     General: There is no distension.  Musculoskeletal:        General: Normal range of motion.     Cervical back: Normal range of motion.  Skin:    Findings: Rash present. Rash is vesicular.  Neurological:     Mental Status: He is alert and oriented to person, place, and time.  Psychiatric:        Behavior: Behavior normal.     ED Results and Treatments Labs (all labs ordered are listed, but only abnormal results are displayed) Labs Reviewed - No data to display                                                                                                                       EKG  EKG Interpretation  Date/Time:    Ventricular Rate:    PR Interval:    QRS Duration:   QT Interval:    QTC Calculation:   R Axis:     Text Interpretation:         Radiology No results found.  Medications Ordered in ED Medications - No data to display Procedures Procedures  (including critical care time) Medical Decision Making / ED Course   Medical Decision Making   Rash is consistent with contact dermatitis. Likely poison ivy/sumac/oak. No superimposed infection. Given the extent, will Rx steroid taper. Instructed to monitor blood sugar levels.     Final Clinical Impression(s) / ED Diagnoses Final diagnoses:  Irritant contact dermatitis due to plants, except food   The patient appears reasonably screened and/or stabilized for discharge and I doubt any other medical condition or other [facility] requiring further screening, evaluation, or treatment in the ED at this time. I  have discussed the findings, Dx and Tx plan with the patient/family who expressed understanding and agree(s) with the plan. Discharge instructions discussed at length. The patient/family was given strict return precautions who verbalized understanding of the instructions. No further questions at time of discharge.  Disposition: Discharge  Condition: Good  ED Discharge  Orders          Ordered    methylPREDNISolone (MEDROL DOSEPAK) 4 MG TBPK tablet        12/30/22 2305              Follow Up: Cathlean Marseilles, MD [id number] [facility] [address] [phone]  Call  to schedule an appointment for close follow up    This chart was dictated using voice recognition software.  Despite best efforts to proofread,  errors can occur which can change the documentation meaning.    Nira Conn, MD 12/30/22 [phone]

## 2022-12-31 ENCOUNTER — Encounter (HOSPITAL_BASED_OUTPATIENT_CLINIC_OR_DEPARTMENT_OTHER): Payer: Self-pay | Admitting: Emergency Medicine

## 2022-12-31 ENCOUNTER — Emergency Department (HOSPITAL_BASED_OUTPATIENT_CLINIC_OR_DEPARTMENT_OTHER)
Admission: EM | Admit: 2022-12-31 | Discharge: 2023-01-01 | Disposition: A | Discharge status: Home / Self Care | Payer: Medicare HMO | Attending: Emergency Medicine | Admitting: Emergency Medicine

## 2022-12-31 DIAGNOSIS — Z7984 Long term (current) use of oral hypoglycemic drugs: Secondary | ICD-10-CM | POA: Diagnosis not present

## 2022-12-31 DIAGNOSIS — L255 Unspecified contact dermatitis due to plants, except food: Secondary | ICD-10-CM | POA: Diagnosis not present

## 2022-12-31 DIAGNOSIS — L259 Unspecified contact dermatitis, unspecified cause: Secondary | ICD-10-CM

## 2022-12-31 DIAGNOSIS — J45909 Unspecified asthma, uncomplicated: Secondary | ICD-10-CM | POA: Insufficient documentation

## 2022-12-31 DIAGNOSIS — Z79899 Other long term (current) drug therapy: Secondary | ICD-10-CM | POA: Diagnosis not present

## 2022-12-31 DIAGNOSIS — I10 Essential (primary) hypertension: Secondary | ICD-10-CM | POA: Insufficient documentation

## 2022-12-31 DIAGNOSIS — E119 Type 2 diabetes mellitus without complications: Secondary | ICD-10-CM | POA: Diagnosis not present

## 2022-12-31 DIAGNOSIS — R21 Rash and other nonspecific skin eruption: Secondary | ICD-10-CM | POA: Diagnosis present

## 2022-12-31 DIAGNOSIS — Z7951 Long term (current) use of inhaled steroids: Secondary | ICD-10-CM | POA: Insufficient documentation

## 2022-12-31 DIAGNOSIS — Z7982 Long term (current) use of aspirin: Secondary | ICD-10-CM | POA: Insufficient documentation

## 2022-12-31 NOTE — ED Triage Notes (Signed)
See last night for rash after exposure to poison ivy/oak Started of steroids  Returns tonight with worsening rash/ swelling around eyes

## 2023-01-01 MED ORDER — METHYLPREDNISOLONE SODIUM SUCC 125 MG IJ SOLR
80.0000 mg | Freq: Once | INTRAMUSCULAR | Status: AC
  Administered 2023-01-01: 80 mg via INTRAVENOUS
  Filled 2023-01-01: qty 2

## 2023-01-01 MED ORDER — FAMOTIDINE IN NACL 20-0.9 MG/50ML-% IV SOLN
20.0000 mg | Freq: Once | INTRAVENOUS | Status: AC
  Administered 2023-01-01: 20 mg via INTRAVENOUS
  Filled 2023-01-01: qty 50

## 2023-01-01 MED ORDER — DIPHENHYDRAMINE HCL 50 MG/ML IJ SOLN
25.0000 mg | Freq: Once | INTRAMUSCULAR | Status: AC
  Administered 2023-01-01: 25 mg via INTRAVENOUS
  Filled 2023-01-01: qty 1

## 2023-01-01 NOTE — ED Provider Notes (Signed)
EMERGENCY DEPARTMENT AT [facility] Provider Note   CSN: [id number] Arrival date & time: 12/31/22  2314     History  Chief Complaint  Patient presents with   Rash    George Rojas is a 67 y.o. male.  Patient is a 67 year old male with past medical history of hyperlipidemia, asthma, hypertension, type 2 diabetes.  Patient presenting today for evaluation of rash and swelling.  Several days ago he was pulling weeds out of the ground, then the next day developed itchy rash to his forearms and the top of his head.  He was seen here yesterday and prescribed a steroid Dosepak.  He has taken 1 dose of this, then this evening noticed swelling around his eyes and forehead.  He denies any difficulty breathing or swallowing.  He denies any oral involvement.  The history is provided by the patient.       Home Medications Prior to Admission medications   Medication Sig Start Date End Date Taking? Authorizing Provider  clopidogrel (PLAVIX) 75 MG tablet Take 75 mg by mouth daily.   Yes [provider]  albuterol (PROVENTIL HFA;VENTOLIN HFA) 108 (90 BASE) MCG/ACT inhaler Inhale 1-2 puffs into the lungs every 6 (six) hours as needed for wheezing or shortness of breath. 04/27/15   Palumbo, April, MD  aspirin EC 81 MG tablet Take 81 mg by mouth at bedtime.     [provider]  atorvastatin (LIPITOR) 10 MG tablet Take 10 mg by mouth at bedtime.     [provider]  azithromycin (ZITHROMAX Z-PAK) 250 MG tablet 2 po day one, then 1 daily x 4 days Patient not taking: Reported on 05/13/2018 09/16/15   Bethann Berkshire, MD  carbamazepine (TEGRETOL) 200 MG tablet Take 200 mg by mouth 2 (two) times daily.     [provider]  cetirizine (ZYRTEC ALLERGY) 10 MG tablet Take 1 tablet (10 mg total) by mouth daily. 04/27/15   Palumbo, April, MD  Cinnamon 500 MG TABS Take by mouth.    [provider]  dextromethorphan-guaiFENesin (MUCINEX DM) 30-600 MG  12hr tablet Take 2 tablets by mouth once.    [provider]  enalapril (VASOTEC) 20 MG tablet Take 20 mg by mouth daily with breakfast.     [provider]  hydrochlorothiazide (HYDRODIURIL) 25 MG tablet Take 25 mg by mouth daily with breakfast.     [provider]  lamoTRIgine (LAMICTAL) 200 MG tablet Take 200-400 mg by mouth 2 (two) times daily. One tablet in the am, and 2 tablets in the pm.    [provider]  LORazepam (ATIVAN) 0.5 MG tablet Take 0.5 mg by mouth every 8 (eight) hours as needed for anxiety.    [provider]  metFORMIN (GLUCOPHAGE) 500 MG tablet Take 500 mg by mouth 2 (two) times daily with a meal.     [provider]  methylPREDNISolone (MEDROL DOSEPAK) 4 MG TBPK tablet Use as directed on the package 12/30/22   Cardama, Amadeo Garnet, MD  predniSONE (DELTASONE) 20 MG tablet 3 tabs po day one, then 2 po daily x 4 days Patient not taking: Reported on 05/13/2018 04/27/15   Palumbo, April, MD  terazosin (HYTRIN) 1 MG capsule Take 1 mg by mouth at bedtime.    [provider]      Allergies    Phenobarbital    Review of Systems   Review of Systems  All other systems reviewed and are negative.  Physical Exam Updated Vital Signs BP (!) 158/86   Pulse 69   Temp 98.2 F (36.8 C) (Oral)   Resp 18   SpO2 95%  Physical Exam Vitals and nursing note reviewed.  Constitutional:      Appearance: Normal appearance.  Eyes:     Comments: There is some swelling of the periorbital soft tissues, but no ocular abnormality.  The conjunctiva is not injected and cornea is clear.  Pulmonary:     Effort: Pulmonary effort is normal. No respiratory distress.     Breath sounds: No wheezing or rhonchi.  Skin:    General: Skin is warm and dry.     Comments: To the top of the head and both forearms, there is a macular, pruritic rash noted.  Neurological:     Mental Status: He is alert and oriented to person, place, and time.      ED Results / Procedures / Treatments   Labs (all labs ordered are listed, but only abnormal results are displayed) Labs Reviewed - No data to display  EKG None  Radiology No results found.  Procedures Procedures    Medications Ordered in ED Medications  diphenhydrAMINE (BENADRYL) injection 25 mg (has no administration in time range)  famotidine (PEPCID) IVPB 20 mg premix (has no administration in time range)  methylPREDNISolone sodium succinate (SOLU-MEDROL) 125 mg/2 mL injection 80 mg (has no administration in time range)    ED Course/ Medical Decision Making/ A&P  Patient seen yesterday for contact dermatitis related to poison ivy/poison sumac.  He was started on prednisone, but is now having swelling to the periorbital soft tissues.  Patient arrives here with stable vital signs and is clinically well-appearing.  He does have some edema to the eyelids and periorbital tissues, but no conjunctival injection or corneal abnormality.  Heart and lung exam is unremarkable.  Patient given an IV dose of Solu-Medrol, Pepcid, and Benadryl and has been observed for over 2 hours.  His symptoms are not worsening and appear to be slightly improving.  There is no airway involvement or signs for anaphylaxis.  I feels the patient can be discharged with continued use of prednisone and Benadryl.  Final Clinical Impression(s) / ED Diagnoses Final diagnoses:  None    Rx / DC Orders ED Discharge Orders     None         Geoffery Lyons, MD 01/01/23 [phone]

## 2023-01-01 NOTE — Discharge Instructions (Signed)
Continue taking prednisone and Benadryl as previously recommended.  Return to the ER if you develop difficulty breathing, airway swelling, or for other new and concerning symptoms.

## 2023-01-01 NOTE — ED Notes (Signed)
Discharge instructions discussed with pt. Pt verbalized understanding. Pt stable and ambulatory.  °

## 2023-04-24 ENCOUNTER — Other Ambulatory Visit: Payer: Self-pay

## 2023-04-24 ENCOUNTER — Emergency Department (HOSPITAL_BASED_OUTPATIENT_CLINIC_OR_DEPARTMENT_OTHER)
Admission: EM | Admit: 2023-04-24 | Discharge: 2023-04-24 | Disposition: A | Discharge status: Home / Self Care | Payer: Medicare HMO | Attending: Emergency Medicine | Admitting: Emergency Medicine

## 2023-04-24 ENCOUNTER — Emergency Department (HOSPITAL_BASED_OUTPATIENT_CLINIC_OR_DEPARTMENT_OTHER): Payer: Medicare HMO

## 2023-04-24 ENCOUNTER — Encounter (HOSPITAL_BASED_OUTPATIENT_CLINIC_OR_DEPARTMENT_OTHER): Payer: Self-pay

## 2023-04-24 ENCOUNTER — Other Ambulatory Visit (HOSPITAL_BASED_OUTPATIENT_CLINIC_OR_DEPARTMENT_OTHER): Payer: Self-pay

## 2023-04-24 DIAGNOSIS — Z7901 Long term (current) use of anticoagulants: Secondary | ICD-10-CM | POA: Insufficient documentation

## 2023-04-24 DIAGNOSIS — K529 Noninfective gastroenteritis and colitis, unspecified: Secondary | ICD-10-CM | POA: Diagnosis not present

## 2023-04-24 DIAGNOSIS — R000 Tachycardia, unspecified: Secondary | ICD-10-CM | POA: Diagnosis not present

## 2023-04-24 DIAGNOSIS — R112 Nausea with vomiting, unspecified: Secondary | ICD-10-CM | POA: Diagnosis present

## 2023-04-24 DIAGNOSIS — Z7982 Long term (current) use of aspirin: Secondary | ICD-10-CM | POA: Diagnosis not present

## 2023-04-24 LAB — URINALYSIS, ROUTINE W REFLEX MICROSCOPIC
Bilirubin Urine: NEGATIVE
Glucose, UA: NEGATIVE mg/dL
Hgb urine dipstick: NEGATIVE
Leukocytes,Ua: NEGATIVE
Nitrite: NEGATIVE
Protein, ur: NEGATIVE mg/dL
Specific Gravity, Urine: 1.032 — ABNORMAL HIGH (ref 1.005–1.030)
pH: 7 (ref 5.0–8.0)

## 2023-04-24 LAB — CBC
HCT: 41.7 % (ref 39.0–52.0)
Hemoglobin: 14.9 g/dL (ref 13.0–17.0)
MCH: 33.3 pg (ref 26.0–34.0)
MCHC: 35.7 g/dL (ref 30.0–36.0)
MCV: 93.1 fL (ref 80.0–100.0)
Platelets: 269 10*3/uL (ref 150–400)
RBC: 4.48 MIL/uL (ref 4.22–5.81)
RDW: 12.4 % (ref 11.5–15.5)
WBC: 6.3 10*3/uL (ref 4.0–10.5)
nRBC: 0 % (ref 0.0–0.2)

## 2023-04-24 LAB — LIPASE, BLOOD: Lipase: 18 U/L (ref 11–51)

## 2023-04-24 LAB — COMPREHENSIVE METABOLIC PANEL
ALT: 14 U/L (ref 0–44)
AST: 12 U/L — ABNORMAL LOW (ref 15–41)
Albumin: 4.2 g/dL (ref 3.5–5.0)
Alkaline Phosphatase: 75 U/L (ref 38–126)
Anion gap: 13 (ref 5–15)
BUN: 8 mg/dL (ref 8–23)
CO2: 25 mmol/L (ref 22–32)
Calcium: 9 mg/dL (ref 8.9–10.3)
Chloride: 96 mmol/L — ABNORMAL LOW (ref 98–111)
Creatinine, Ser: 0.87 mg/dL (ref 0.61–1.24)
GFR, Estimated: >60 mL/min (ref >60)
Glucose, Bld: 182 mg/dL — ABNORMAL HIGH (ref 70–99)
Potassium: 3.3 mmol/L — ABNORMAL LOW (ref 3.5–5.1)
Sodium: 134 mmol/L — ABNORMAL LOW (ref 135–145)
Total Bilirubin: 0.6 mg/dL (ref 0.3–1.2)
Total Protein: 6.9 g/dL (ref 6.5–8.1)

## 2023-04-24 MED ORDER — IOHEXOL 300 MG/ML  SOLN
100.0000 mL | Freq: Once | INTRAMUSCULAR | Status: AC | PRN
  Administered 2023-04-24: 100 mL via INTRAVENOUS

## 2023-04-24 MED ORDER — ONDANSETRON HCL 4 MG/2ML IJ SOLN
4.0000 mg | Freq: Once | INTRAMUSCULAR | Status: AC
  Administered 2023-04-24: 4 mg via INTRAVENOUS
  Filled 2023-04-24: qty 2

## 2023-04-24 MED ORDER — ONDANSETRON HCL 4 MG PO TABS
4.0000 mg | ORAL_TABLET | Freq: Four times a day (QID) | ORAL | 0 refills | Status: DC
  Filled 2023-04-24: qty 12, 3d supply, fill #0

## 2023-04-24 MED ORDER — POTASSIUM CHLORIDE 10 MEQ/100ML IV SOLN
10.0000 meq | Freq: Once | INTRAVENOUS | Status: AC
  Administered 2023-04-24: 10 meq via INTRAVENOUS
  Filled 2023-04-24: qty 100

## 2023-04-24 MED ORDER — LACTATED RINGERS IV BOLUS
1000.0000 mL | Freq: Once | INTRAVENOUS | Status: AC
  Administered 2023-04-24: 1000 mL via INTRAVENOUS

## 2023-04-24 MED ORDER — ONDANSETRON HCL 4 MG PO TABS: 4.0000 mg | ORAL_TABLET | Freq: Three times a day (TID) | ORAL | Status: DC | PRN

## 2023-04-24 NOTE — ED Triage Notes (Signed)
Pt states that he has had n/v/d for the past two days, denies fevers.

## 2023-04-24 NOTE — ED Provider Notes (Signed)
EMERGENCY DEPARTMENT AT [facility] Provider Note   CSN: [id number] Arrival date & time: 04/24/23  0541     History  Chief Complaint  Patient presents with   Vomiting    George Rojas is a 67 y.o. male.  67 year old male here today for nausea vomiting diarrhea of 2 days duration.  Patient has no history of intra-abdominal surgeries.  He has had crampy abdominal pain.  No fever, no chest pain.        Home Medications Prior to Admission medications   Medication Sig Start Date End Date Taking? Authorizing Provider  albuterol (PROVENTIL HFA;VENTOLIN HFA) 108 (90 BASE) MCG/ACT inhaler Inhale 1-2 puffs into the lungs every 6 (six) hours as needed for wheezing or shortness of breath. 04/27/15   Palumbo, April, MD  aspirin EC 81 MG tablet Take 81 mg by mouth at bedtime.     [provider]  atorvastatin (LIPITOR) 10 MG tablet Take 10 mg by mouth at bedtime.     [provider]  azithromycin (ZITHROMAX Z-PAK) 250 MG tablet 2 po day one, then 1 daily x 4 days Patient not taking: Reported on 05/13/2018 09/16/15   Bethann Berkshire, MD  carbamazepine (TEGRETOL) 200 MG tablet Take 200 mg by mouth 2 (two) times daily.     [provider]  cetirizine (ZYRTEC ALLERGY) 10 MG tablet Take 1 tablet (10 mg total) by mouth daily. 04/27/15   Palumbo, April, MD  Cinnamon 500 MG TABS Take by mouth.    [provider]  clopidogrel (PLAVIX) 75 MG tablet Take 75 mg by mouth daily.    [provider]  dextromethorphan-guaiFENesin (MUCINEX DM) 30-600 MG 12hr tablet Take 2 tablets by mouth once.    [provider]  enalapril (VASOTEC) 20 MG tablet Take 20 mg by mouth daily with breakfast.     [provider]  hydrochlorothiazide (HYDRODIURIL) 25 MG tablet Take 25 mg by mouth daily with breakfast.     [provider]  lamoTRIgine (LAMICTAL) 200 MG tablet Take 200-400 mg by mouth 2 (two) times daily. One tablet in the am,  and 2 tablets in the pm.    [provider]  LORazepam (ATIVAN) 0.5 MG tablet Take 0.5 mg by mouth every 8 (eight) hours as needed for anxiety.    [provider]  metFORMIN (GLUCOPHAGE) 500 MG tablet Take 500 mg by mouth 2 (two) times daily with a meal.     [provider]  methylPREDNISolone (MEDROL DOSEPAK) 4 MG TBPK tablet Use as directed on the package 12/30/22   Cardama, Amadeo Garnet, MD  predniSONE (DELTASONE) 20 MG tablet 3 tabs po day one, then 2 po daily x 4 days Patient not taking: Reported on 05/13/2018 04/27/15   Palumbo, April, MD  terazosin (HYTRIN) 1 MG capsule Take 1 mg by mouth at bedtime.    [provider]      Allergies    Phenobarbital    Review of Systems   Review of Systems  Physical Exam Updated Vital Signs BP (!) 166/93   Pulse (!) 102   Temp 98.2 F (36.8 C) (Oral)   Resp 18   SpO2 97%  Physical Exam Vitals reviewed.  Constitutional:      Appearance: Normal appearance.  HENT:     Head: Normocephalic and atraumatic.  Cardiovascular:     Rate and Rhythm: Tachycardia present.  Pulmonary:     Effort: Pulmonary effort is normal.  Abdominal:  General: Abdomen is flat. There is no distension.     Palpations: Abdomen is soft.     Tenderness: There is no abdominal tenderness. There is no guarding or rebound.  Neurological:     General: No focal deficit present.     Mental Status: He is alert.     ED Results / Procedures / Treatments   Labs (all labs ordered are listed, but only abnormal results are displayed) Labs Reviewed  CBC  LIPASE, BLOOD  COMPREHENSIVE METABOLIC PANEL  URINALYSIS, ROUTINE W REFLEX MICROSCOPIC    EKG None  Radiology No results found.  Procedures Procedures    Medications Ordered in ED Medications  ondansetron (ZOFRAN) injection 4 mg (has no administration in time range)  lactated ringers bolus 1,000 mL (has no administration in time range)  lactated ringers bolus 1,000 mL  (has no administration in time range)    ED Course/ Medical Decision Making/ A&P                                 Medical Decision Making 67 year old male who is here today for nausea vomiting and diarrhea.  Differential diagnoses include enteritis, gastroenteritis, viral gastroenteritis, less likely bowel obstruction, dehydration, less likely cardiac etiology.  Plan- patient overall looks well, has been having pretty consistent vomiting diarrhea over the last couple of days.  Consistent with a viral gastroenteritis.  Antiemetics, fluids provided.  Labs ordered at triage.  Will obtain imaging of the patient's abdomen pelvis given age.  Reassessment-reviewed the patient's labs, no elevation in his LFTs, alk phos.  CT imaging did show quite a few gallstones, so obtained a right upper quadrant ultrasound which did not show significant gallbladder wall thickening.  Patient's symptoms not consistent with acute cholecystitis.  Discussed this with the patient.  He will follow-up with his primary care doctor.  Discharge home.  Amount and/or Complexity of Data Reviewed Labs: ordered. Radiology: ordered.  Risk Prescription drug management.           Final Clinical Impression(s) / ED Diagnoses Final diagnoses:  None    Rx / DC Orders ED Discharge Orders     None         Arletha Pili, DO 04/24/23 1103

## 2023-04-24 NOTE — Discharge Instructions (Addendum)
You can take Zofran every 8 hours as needed for nausea.  Your symptoms should begin to improve in the next 24 to 48 hours.  Like we discussed, you have gallstones, but they do not appear to be causing you problems today.  Come back to the emergency department if you severe sudden or severe pain in the right upper part of your abdomen.  Follow-up with your primary care doctor this week.

## 2023-04-30 ENCOUNTER — Other Ambulatory Visit: Payer: Self-pay

## 2023-07-10 NOTE — Progress Notes (Addendum)
Anesthesia Review:  PCP:  Madelon Lips LOV 07/09/23  Cardiologist : Eppie Gibson LOV 12/07/22  Neuro- Linh Ngo LOV 06/14/23  Chest x-ray : EKG : 12/07/22- [facility]  Echo : Stress test: Cardiac Cath :  Activity level:  Sleep Study/ CPAP : Fasting Blood Sugar :      / Checks Blood Sugar -- times a day:   Blood Thinner/ Instructions /Last Dose: ASA / Instructions/ Last Dose :    Plavix-    DM- type Hgba1c-  Glucotrol-

## 2023-07-10 NOTE — Patient Instructions (Signed)
SURGICAL WAITING ROOM VISITATION  Patients having surgery or a procedure may have no more than 2 support people in the waiting area - these visitors may rotate.    Children under the age of 63 must have an adult with them who is not the patient.  Due to an increase in RSV and influenza rates and associated hospitalizations, children ages 66 and under may not visit patients in [facility] hospitals.  If the patient needs to stay at the hospital during part of their recovery, the visitor guidelines for inpatient rooms apply. Pre-op nurse will coordinate an appropriate time for 1 support person to accompany patient in pre-op.  This support person may not rotate.    Please refer to the [facility] website for the visitor guidelines for Inpatients (after your surgery is over and you are in a regular room).       Your procedure is scheduled on:  07/24/23    Report to [facility] Main Entrance    Report to admitting at   [phone]   Call this number if you have problems the morning of surgery [phone]   Do not eat food :After Midnight.   After Midnight you may have the following liquids until _ 0845_____ AM DAY OF SURGERY  Water Non-Citrus Juices (without pulp, NO RED-Apple, White grape, White cranberry) Black Coffee (NO MILK/CREAM OR CREAMERS, sugar ok)  Clear Tea (NO MILK/CREAM OR CREAMERS, sugar ok) regular and decaf                             Plain Jell-O (NO RED)                                           Fruit ices (not with fruit pulp, NO RED)                                     Popsicles (NO RED)                                                               Sports drinks like Gatorade (NO RED)                     The day of surgery:  Drink ONE (1) Pre-Surgery Clear Ensure or G2 at  0845AM ( have completed by )  the morning of surgery. Drink in one sitting. Do not sip.  This drink was given to you during your hospital  pre-op appointment visit. Nothing else to  drink after completing the  Pre-Surgery Clear Ensure or G2.          If you have questions, please contact your surgeon's office.       Oral Hygiene is also important to reduce your risk of infection.                                    Remember - BRUSH YOUR TEETH THE MORNING OF SURGERY WITH  YOUR REGULAR TOOTHPASTE  DENTURES WILL BE REMOVED PRIOR TO SURGERY PLEASE DO NOT APPLY "Poly grip" OR ADHESIVES!!!   Do NOT smoke after Midnight   Stop all vitamins and herbal supplements 7 days before surgery.   Take these medicines the morning of surgery with A SIP OF WATER:  inhalers as usual and bring, tegretol, toprol, synthroid, lamictal, zoloft            Glucotrol- none am of surgery   DO NOT TAKE ANY ORAL DIABETIC MEDICATIONS DAY OF YOUR SURGERY  Bring CPAP mask and tubing day of surgery.                              You may not have any metal on your body including hair pins, jewelry, and body piercing             Do not wear make-up, lotions, powders, perfumes/cologne, or deodorant  Do not wear nail polish including gel and S&S, artificial/acrylic nails, or any other type of covering on natural nails including finger and toenails. If you have artificial nails, gel coating, etc. that needs to be removed by a nail salon please have this removed prior to surgery or surgery may need to be canceled/ delayed if the surgeon/ anesthesia feels like they are unable to be safely monitored.   Do not shave  48 hours prior to surgery.               Men may shave face and neck.   Do not bring valuables to the hospital. [facility] IS NOT             RESPONSIBLE   FOR VALUABLES.   Contacts, glasses, dentures or bridgework may not be worn into surgery.   Bring small overnight bag day of surgery.   DO NOT BRING YOUR HOME MEDICATIONS TO THE HOSPITAL. PHARMACY WILL DISPENSE MEDICATIONS LISTED ON YOUR MEDICATION LIST TO YOU DURING YOUR ADMISSION IN THE HOSPITAL!    Patients discharged on the day  of surgery will not be allowed to drive home.  Someone NEEDS to stay with you for the first 24 hours after anesthesia.   Special Instructions: Bring a copy of your healthcare power of attorney and living will documents the day of surgery if you haven't scanned them before.              Please read over the following fact sheets you were given: IF YOU HAVE QUESTIONS ABOUT YOUR PRE-OP INSTRUCTIONS PLEASE CALL [phone]   If you received a COVID test during your pre-op visit  it is requested that you wear a mask when out in public, stay away from anyone that may not be feeling well and notify your surgeon if you develop symptoms. If you test positive for Covid or have been in contact with anyone that has tested positive in the last 10 days please notify you surgeon.      Pre-operative 5 CHG Bath Instructions   You can play a key role in reducing the risk of infection after surgery. Your skin needs to be as free of germs as possible. You can reduce the number of germs on your skin by washing with CHG (chlorhexidine gluconate) soap before surgery. CHG is an antiseptic soap that kills germs and continues to kill germs even after washing.   DO NOT use if you have an allergy to chlorhexidine/CHG or antibacterial soaps. If your skin  becomes reddened or irritated, stop using the CHG and notify one of our RNs at [phone].   Please shower with the CHG soap starting 4 days before surgery using the following schedule:     Please keep in mind the following:  DO NOT shave, including legs and underarms, starting the day of your first shower.   You may shave your face at any point before/day of surgery.  Place clean sheets on your bed the day you start using CHG soap. Use a clean washcloth (not used since being washed) for each shower. DO NOT sleep with pets once you start using the CHG.   CHG Shower Instructions:  If you choose to wash your hair and private area, wash first with your normal  shampoo/soap.  After you use shampoo/soap, rinse your hair and body thoroughly to remove shampoo/soap residue.  Turn the water OFF and apply about 3 tablespoons (45 ml) of CHG soap to a CLEAN washcloth.  Apply CHG soap ONLY FROM YOUR NECK DOWN TO YOUR TOES (washing for 3-5 minutes)  DO NOT use CHG soap on face, private areas, open wounds, or sores.  Pay special attention to the area where your surgery is being performed.  If you are having back surgery, having someone wash your back for you may be helpful. Wait 2 minutes after CHG soap is applied, then you may rinse off the CHG soap.  Pat dry with a clean towel  Put on clean clothes/pajamas   If you choose to wear lotion, please use ONLY the CHG-compatible lotions on the back of this paper.     Additional instructions for the day of surgery: DO NOT APPLY any lotions, deodorants, cologne, or perfumes.   Put on clean/comfortable clothes.  Brush your teeth.  Ask your nurse before applying any prescription medications to the skin.      CHG Compatible Lotions   Aveeno Moisturizing lotion  Cetaphil Moisturizing Cream  Cetaphil Moisturizing Lotion  Clairol Herbal Essence Moisturizing Lotion, Dry Skin  Clairol Herbal Essence Moisturizing Lotion, Extra Dry Skin  Clairol Herbal Essence Moisturizing Lotion, Normal Skin  Curel Age Defying Therapeutic Moisturizing Lotion with Alpha Hydroxy  Curel Extreme Care Body Lotion  Curel Soothing Hands Moisturizing Hand Lotion  Curel Therapeutic Moisturizing Cream, Fragrance-Free  Curel Therapeutic Moisturizing Lotion, Fragrance-Free  Curel Therapeutic Moisturizing Lotion, Original Formula  Eucerin Daily Replenishing Lotion  Eucerin Dry Skin Therapy Plus Alpha Hydroxy Crme  Eucerin Dry Skin Therapy Plus Alpha Hydroxy Lotion  Eucerin Original Crme  Eucerin Original Lotion  Eucerin Plus Crme Eucerin Plus Lotion  Eucerin TriLipid Replenishing Lotion  Keri Anti-Bacterial Hand Lotion  Keri Deep  Conditioning Original Lotion Dry Skin Formula Softly Scented  Keri Deep Conditioning Original Lotion, Fragrance Free Sensitive Skin Formula  Keri Lotion Fast Absorbing Fragrance Free Sensitive Skin Formula  Keri Lotion Fast Absorbing Softly Scented Dry Skin Formula  Keri Original Lotion  Keri Skin Renewal Lotion Keri Silky Smooth Lotion  Keri Silky Smooth Sensitive Skin Lotion  Nivea Body Creamy Conditioning Oil  Nivea Body Extra Enriched Teacher, adult education Moisturizing Lotion Nivea Crme  Nivea Skin Firming Lotion  NutraDerm 30 Skin Lotion  NutraDerm Skin Lotion  NutraDerm Therapeutic Skin Cream  NutraDerm Therapeutic Skin Lotion  ProShield Protective Hand Cream  Provon moisturizing lotion

## 2023-07-12 ENCOUNTER — Other Ambulatory Visit: Payer: Self-pay

## 2023-07-12 ENCOUNTER — Encounter (HOSPITAL_COMMUNITY): Payer: Self-pay

## 2023-07-12 ENCOUNTER — Encounter (HOSPITAL_COMMUNITY)
Admission: RE | Admit: 2023-07-12 | Discharge: 2023-07-12 | Disposition: A | Discharge status: Home / Self Care | Payer: Medicare HMO | Source: Ambulatory Visit | Attending: Orthopedic Surgery | Admitting: Orthopedic Surgery

## 2023-07-12 VITALS — BP 127/87 | HR 73 | Temp 98.1°F | Resp 16 | Ht 71.0 in | Wt 225.0 lb

## 2023-07-12 DIAGNOSIS — Z01818 Encounter for other preprocedural examination: Secondary | ICD-10-CM | POA: Diagnosis present

## 2023-07-12 DIAGNOSIS — Z01812 Encounter for preprocedural laboratory examination: Secondary | ICD-10-CM | POA: Insufficient documentation

## 2023-07-12 DIAGNOSIS — E119 Type 2 diabetes mellitus without complications: Secondary | ICD-10-CM | POA: Insufficient documentation

## 2023-07-12 HISTORY — DX: Unspecified osteoarthritis, unspecified site: M19.90

## 2023-07-12 HISTORY — DX: Malignant (primary) neoplasm, unspecified: C80.1

## 2023-07-12 HISTORY — DX: Hypothyroidism, unspecified: E03.9

## 2023-07-12 LAB — CBC
HCT: 43.1 % (ref 39.0–52.0)
Hemoglobin: 14.4 g/dL (ref 13.0–17.0)
MCH: 33.2 pg (ref 26.0–34.0)
MCHC: 33.4 g/dL (ref 30.0–36.0)
MCV: 99.3 fL (ref 80.0–100.0)
Platelets: 250 10*3/uL (ref 150–400)
RBC: 4.34 MIL/uL (ref 4.22–5.81)
RDW: 13.5 % (ref 11.5–15.5)
WBC: 7.2 10*3/uL (ref 4.0–10.5)
nRBC: 0 % (ref 0.0–0.2)

## 2023-07-12 LAB — HEMOGLOBIN A1C
Hgb A1c MFr Bld: 6.2 % — ABNORMAL HIGH (ref 4.8–5.6)
Mean Plasma Glucose: 131.24 mg/dL

## 2023-07-12 LAB — BASIC METABOLIC PANEL
Anion gap: 8 (ref 5–15)
BUN: 12 mg/dL (ref 8–23)
CO2: 30 mmol/L (ref 22–32)
Calcium: 9.2 mg/dL (ref 8.9–10.3)
Chloride: 102 mmol/L (ref 98–111)
Creatinine, Ser: 1.12 mg/dL (ref 0.61–1.24)
GFR, Estimated: >60 mL/min (ref >60)
Glucose, Bld: 133 mg/dL — ABNORMAL HIGH (ref 70–99)
Potassium: 3.8 mmol/L (ref 3.5–5.1)
Sodium: 140 mmol/L (ref 135–145)

## 2023-07-12 LAB — GLUCOSE, CAPILLARY: Glucose-Capillary: 155 mg/dL — ABNORMAL HIGH (ref 70–99)

## 2023-07-12 LAB — SURGICAL PCR SCREEN
MRSA, PCR: NEGATIVE
Staphylococcus aureus: NEGATIVE

## 2023-07-12 NOTE — Progress Notes (Addendum)
COVID Vaccine received:  [x]  No []  Yes Date of any COVID positive Test in last 90 days: no PCP - Madelon Lips MD in [facility] Cardiologist - Eppie Gibson MD Neuro- Donnetta Hail at [facility]  Chest x-ray -  EKG -   Stress Test -  ECHO -  Cardiac Cath -   Bowel Prep - [x]  No  []   Yes ______  Pacemaker / ICD device [x]  No []  Yes   Spinal Cord Stimulator:[x]  No []  Yes       History of Sleep Apnea? [x]  No []  Yes   CPAP used?- [x]  No []  Yes    Does the patient monitor blood sugar?          []  No [x]  Yes  []  N/A  Patient has: []  NO Hx DM   []  Pre-DM                 []  DM1  [x]   DM2 Does patient have a [employer] or Dexacom? [x]  No []  Yes   Fasting Blood Sugar Ranges- 114 Checks Blood Sugar ____1-2_ times a day  GLP1 agonist / usual dose - no GLP1 instructions:  SGLT-2 inhibitors / usual dose - no SGLT-2 instructions:   Blood Thinner / Instructions:Plavix- hold 1 week prior to surgery per MD office Aspirin Instructions:no  Comments:   Activity level: Patient is  unable to climb a flight of stairs without difficulty; [x]  No CP  [x]  No SOB, but would have __Knee Pain_   Patient can  perform ADLs without assistance.   Anesthesia review: DM,HTN, CAD-stents  Patient denies shortness of breath, fever, cough and chest pain at PAT appointment.  Patient verbalized understanding and agreement to the Pre-Surgical Instructions that were given to them at this PAT appointment. Patient was also educated of the need to review these PAT instructions again prior to his/her surgery.I reviewed the appropriate phone numbers to call if they have any and questions or concerns.

## 2023-07-24 ENCOUNTER — Observation Stay (HOSPITAL_COMMUNITY)
Admission: RE | Admit: 2023-07-24 | Discharge: 2023-07-26 | Disposition: A | Discharge status: Home / Self Care | Payer: Medicare HMO | Attending: Orthopedic Surgery | Admitting: Orthopedic Surgery

## 2023-07-24 ENCOUNTER — Other Ambulatory Visit: Payer: Self-pay

## 2023-07-24 ENCOUNTER — Encounter (HOSPITAL_COMMUNITY): Payer: Self-pay | Admitting: Orthopedic Surgery

## 2023-07-24 ENCOUNTER — Ambulatory Visit (HOSPITAL_COMMUNITY): Payer: Medicare HMO

## 2023-07-24 ENCOUNTER — Encounter (HOSPITAL_COMMUNITY)
Admission: RE | Disposition: A | Discharge status: Home / Self Care | Payer: Self-pay | Source: Home / Self Care | Attending: Orthopedic Surgery

## 2023-07-24 DIAGNOSIS — Z7984 Long term (current) use of oral hypoglycemic drugs: Secondary | ICD-10-CM | POA: Diagnosis not present

## 2023-07-24 DIAGNOSIS — Z79899 Other long term (current) drug therapy: Secondary | ICD-10-CM | POA: Diagnosis not present

## 2023-07-24 DIAGNOSIS — Z7902 Long term (current) use of antithrombotics/antiplatelets: Secondary | ICD-10-CM | POA: Diagnosis not present

## 2023-07-24 DIAGNOSIS — Z01818 Encounter for other preprocedural examination: Secondary | ICD-10-CM

## 2023-07-24 DIAGNOSIS — E119 Type 2 diabetes mellitus without complications: Secondary | ICD-10-CM | POA: Diagnosis not present

## 2023-07-24 DIAGNOSIS — I10 Essential (primary) hypertension: Secondary | ICD-10-CM | POA: Diagnosis not present

## 2023-07-24 DIAGNOSIS — E039 Hypothyroidism, unspecified: Secondary | ICD-10-CM | POA: Insufficient documentation

## 2023-07-24 DIAGNOSIS — Z8509 Personal history of malignant neoplasm of other digestive organs: Secondary | ICD-10-CM | POA: Diagnosis not present

## 2023-07-24 DIAGNOSIS — J45909 Unspecified asthma, uncomplicated: Secondary | ICD-10-CM | POA: Diagnosis not present

## 2023-07-24 DIAGNOSIS — Z96652 Presence of left artificial knee joint: Principal | ICD-10-CM

## 2023-07-24 DIAGNOSIS — M1712 Unilateral primary osteoarthritis, left knee: Principal | ICD-10-CM | POA: Insufficient documentation

## 2023-07-24 HISTORY — PX: TOTAL KNEE ARTHROPLASTY: SHX125

## 2023-07-24 LAB — GLUCOSE, CAPILLARY
Glucose-Capillary: 157 mg/dL — ABNORMAL HIGH (ref 70–99)
Glucose-Capillary: 151 mg/dL — ABNORMAL HIGH (ref 70–99)
Glucose-Capillary: 303 mg/dL — ABNORMAL HIGH (ref 70–99)
Glucose-Capillary: 216 mg/dL — ABNORMAL HIGH (ref 70–99)

## 2023-07-24 SURGERY — ARTHROPLASTY, KNEE, TOTAL
Anesthesia: Spinal | Site: Knee | Laterality: Left

## 2023-07-24 MED ORDER — 0.9 % SODIUM CHLORIDE (POUR BTL) OPTIME
TOPICAL | Status: DC | PRN
  Administered 2023-07-24: 1000 mL

## 2023-07-24 MED ORDER — GABAPENTIN 300 MG PO CAPS
300.0000 mg | ORAL_CAPSULE | Freq: Every day | ORAL | Status: DC
Start: 2023-07-24 — End: 2023-07-26
  Administered 2023-07-24 – 2023-07-25 (×2): 300 mg via ORAL
  Filled 2023-07-24 (×2): qty 1

## 2023-07-24 MED ORDER — POLYETHYLENE GLYCOL 3350 17 G PO PACK
17.0000 g | PACK | Freq: Two times a day (BID) | ORAL | Status: DC
  Administered 2023-07-24 – 2023-07-26 (×3): 17 g via ORAL
  Filled 2023-07-24 (×4): qty 1

## 2023-07-24 MED ORDER — PROPOFOL 500 MG/50ML IV EMUL
INTRAVENOUS | Status: DC | PRN
  Administered 2023-07-24: 85 ug/kg/min via INTRAVENOUS

## 2023-07-24 MED ORDER — INSULIN ASPART 100 UNIT/ML IJ SOLN
INTRAMUSCULAR | Status: AC
  Filled 2023-07-24: qty 1

## 2023-07-24 MED ORDER — OXYCODONE HCL 5 MG PO TABS
ORAL_TABLET | ORAL | Status: AC
  Administered 2023-07-24: 5 mg via ORAL
  Filled 2023-07-24: qty 1

## 2023-07-24 MED ORDER — ATORVASTATIN CALCIUM 40 MG PO TABS
80.0000 mg | ORAL_TABLET | Freq: Every day | ORAL | Status: DC
  Administered 2023-07-25 – 2023-07-26 (×2): 80 mg via ORAL
  Filled 2023-07-24 (×2): qty 2

## 2023-07-24 MED ORDER — STERILE WATER FOR IRRIGATION IR SOLN
Status: DC | PRN
  Administered 2023-07-24: 1000 mL

## 2023-07-24 MED ORDER — POVIDONE-IODINE 10 % EX SWAB: 2.0000 | Freq: Once | CUTANEOUS | Status: DC

## 2023-07-24 MED ORDER — DEXAMETHASONE SODIUM PHOSPHATE 10 MG/ML IJ SOLN
10.0000 mg | Freq: Once | INTRAMUSCULAR | Status: AC
  Administered 2023-07-25: 10 mg via INTRAVENOUS
  Filled 2023-07-24: qty 1

## 2023-07-24 MED ORDER — SODIUM CHLORIDE 0.9% FLUSH
3.0000 mL | Freq: Two times a day (BID) | INTRAVENOUS | Status: DC
  Administered 2023-07-25 – 2023-07-26 (×3): 10 mL via INTRAVENOUS

## 2023-07-24 MED ORDER — OXYCODONE HCL 5 MG PO TABS
10.0000 mg | ORAL_TABLET | ORAL | Status: DC | PRN
  Filled 2023-07-24: qty 2

## 2023-07-24 MED ORDER — ORAL CARE MOUTH RINSE
15.0000 mL | Freq: Once | OROMUCOSAL | Status: AC
Start: 2023-07-24 — End: 2023-07-24

## 2023-07-24 MED ORDER — METOCLOPRAMIDE HCL 5 MG/ML IJ SOLN: 5.0000 mg | Freq: Three times a day (TID) | INTRAMUSCULAR | Status: DC | PRN

## 2023-07-24 MED ORDER — MENTHOL 3 MG MT LOZG: 1.0000 | LOZENGE | OROMUCOSAL | Status: DC | PRN

## 2023-07-24 MED ORDER — INSULIN ASPART 100 UNIT/ML IJ SOLN
0.0000 [IU] | Freq: Three times a day (TID) | INTRAMUSCULAR | Status: DC
  Administered 2023-07-24: 11 [IU] via SUBCUTANEOUS
  Administered 2023-07-25 (×2): 5 [IU] via SUBCUTANEOUS
  Administered 2023-07-25: 3 [IU] via SUBCUTANEOUS
  Administered 2023-07-26: 2 [IU] via SUBCUTANEOUS

## 2023-07-24 MED ORDER — ONDANSETRON HCL 4 MG PO TABS: 4.0000 mg | ORAL_TABLET | Freq: Four times a day (QID) | ORAL | Status: DC | PRN

## 2023-07-24 MED ORDER — DEXAMETHASONE SODIUM PHOSPHATE 10 MG/ML IJ SOLN
INTRAMUSCULAR | Status: DC | PRN
  Administered 2023-07-24: 10 mg

## 2023-07-24 MED ORDER — CARBAMAZEPINE 200 MG PO TABS
200.0000 mg | ORAL_TABLET | Freq: Two times a day (BID) | ORAL | Status: DC
  Administered 2023-07-24 – 2023-07-26 (×4): 200 mg via ORAL
  Filled 2023-07-24 (×4): qty 1

## 2023-07-24 MED ORDER — CEFAZOLIN SODIUM-DEXTROSE 2-4 GM/100ML-% IV SOLN
2.0000 g | Freq: Four times a day (QID) | INTRAVENOUS | Status: AC
  Administered 2023-07-24 – 2023-07-25 (×2): 2 g via INTRAVENOUS
  Filled 2023-07-24 (×2): qty 100

## 2023-07-24 MED ORDER — TERAZOSIN HCL 1 MG PO CAPS
1.0000 mg | ORAL_CAPSULE | Freq: Every day | ORAL | Status: DC
  Administered 2023-07-24 – 2023-07-25 (×2): 1 mg via ORAL
  Filled 2023-07-24 (×2): qty 1

## 2023-07-24 MED ORDER — METHOCARBAMOL 500 MG PO TABS
500.0000 mg | ORAL_TABLET | Freq: Four times a day (QID) | ORAL | Status: DC | PRN
Start: 2023-07-24 — End: 2023-07-26
  Administered 2023-07-24 – 2023-07-26 (×4): 500 mg via ORAL
  Filled 2023-07-24 (×4): qty 1

## 2023-07-24 MED ORDER — ONDANSETRON HCL 4 MG/2ML IJ SOLN
INTRAMUSCULAR | Status: AC
Start: 2023-07-24 — End: ?
  Filled 2023-07-24: qty 2

## 2023-07-24 MED ORDER — HYDROMORPHONE HCL 1 MG/ML IJ SOLN: 0.5000 mg | INTRAMUSCULAR | Status: DC | PRN

## 2023-07-24 MED ORDER — ASPIRIN 81 MG PO CHEW
81.0000 mg | CHEWABLE_TABLET | Freq: Two times a day (BID) | ORAL | Status: DC
  Administered 2023-07-24 – 2023-07-25 (×2): 81 mg via ORAL
  Filled 2023-07-24 (×2): qty 1

## 2023-07-24 MED ORDER — ACETAMINOPHEN 10 MG/ML IV SOLN: 1000.0000 mg | Freq: Once | INTRAVENOUS | Status: DC | PRN

## 2023-07-24 MED ORDER — SODIUM CHLORIDE (PF) 0.9 % IJ SOLN
INTRAMUSCULAR | Status: DC | PRN
  Administered 2023-07-24: 61 mL

## 2023-07-24 MED ORDER — ALBUTEROL SULFATE (2.5 MG/3ML) 0.083% IN NEBU: 3.0000 mL | INHALATION_SOLUTION | Freq: Four times a day (QID) | RESPIRATORY_TRACT | Status: DC | PRN

## 2023-07-24 MED ORDER — GLIPIZIDE 5 MG PO TABS
5.0000 mg | ORAL_TABLET | Freq: Every day | ORAL | Status: DC
  Administered 2023-07-25: 5 mg via ORAL
  Filled 2023-07-24: qty 1

## 2023-07-24 MED ORDER — ROPINIROLE HCL 0.5 MG PO TABS
0.5000 mg | ORAL_TABLET | Freq: Every day | ORAL | Status: DC
Start: 2023-07-24 — End: 2023-07-26
  Administered 2023-07-24 – 2023-07-25 (×2): 0.5 mg via ORAL
  Filled 2023-07-24 (×2): qty 1

## 2023-07-24 MED ORDER — INSULIN ASPART 100 UNIT/ML IJ SOLN
0.0000 [IU] | INTRAMUSCULAR | Status: DC | PRN
  Administered 2023-07-24: 2 [IU] via SUBCUTANEOUS

## 2023-07-24 MED ORDER — LEVOTHYROXINE SODIUM 100 MCG PO TABS
100.0000 ug | ORAL_TABLET | Freq: Every day | ORAL | Status: DC
  Administered 2023-07-25 – 2023-07-26 (×2): 100 ug via ORAL
  Filled 2023-07-24 (×2): qty 1

## 2023-07-24 MED ORDER — BISACODYL 10 MG RE SUPP: 10.0000 mg | Freq: Every day | RECTAL | Status: DC | PRN

## 2023-07-24 MED ORDER — POLYETHYLENE GLYCOL 3350 17 G PO PACK: 17.0000 g | PACK | Freq: Two times a day (BID) | ORAL | Status: DC

## 2023-07-24 MED ORDER — PROPOFOL 10 MG/ML IV BOLUS
INTRAVENOUS | Status: AC
  Filled 2023-07-24: qty 20

## 2023-07-24 MED ORDER — SERTRALINE HCL 25 MG PO TABS
25.0000 mg | ORAL_TABLET | Freq: Every day | ORAL | Status: DC
Start: 2023-07-25 — End: 2023-07-26
  Administered 2023-07-25 – 2023-07-26 (×2): 25 mg via ORAL
  Filled 2023-07-24 (×2): qty 1

## 2023-07-24 MED ORDER — DEXAMETHASONE SODIUM PHOSPHATE 10 MG/ML IJ SOLN
8.0000 mg | Freq: Once | INTRAMUSCULAR | Status: AC
  Administered 2023-07-24: 8 mg via INTRAVENOUS

## 2023-07-24 MED ORDER — FENTANYL CITRATE PF 50 MCG/ML IJ SOSY
50.0000 ug | PREFILLED_SYRINGE | INTRAMUSCULAR | Status: AC
  Administered 2023-07-24: 50 ug via INTRAVENOUS
  Filled 2023-07-24: qty 2

## 2023-07-24 MED ORDER — DEXAMETHASONE SODIUM PHOSPHATE 10 MG/ML IJ SOLN
INTRAMUSCULAR | Status: AC
  Filled 2023-07-24: qty 1

## 2023-07-24 MED ORDER — MIDAZOLAM HCL 2 MG/2ML IJ SOLN
1.0000 mg | INTRAMUSCULAR | Status: AC
  Administered 2023-07-24: 1 mg via INTRAVENOUS
  Filled 2023-07-24: qty 2

## 2023-07-24 MED ORDER — ALBUMIN HUMAN 5 % IV SOLN
INTRAVENOUS | Status: AC
  Filled 2023-07-24: qty 250

## 2023-07-24 MED ORDER — SODIUM CHLORIDE 0.9 % IR SOLN
Status: DC | PRN
  Administered 2023-07-24: 1000 mL

## 2023-07-24 MED ORDER — METOCLOPRAMIDE HCL 5 MG PO TABS: 5.0000 mg | ORAL_TABLET | Freq: Three times a day (TID) | ORAL | Status: DC | PRN

## 2023-07-24 MED ORDER — CLOPIDOGREL BISULFATE 75 MG PO TABS
75.0000 mg | ORAL_TABLET | Freq: Every day | ORAL | Status: DC
  Administered 2023-07-25 – 2023-07-26 (×2): 75 mg via ORAL
  Filled 2023-07-24 (×2): qty 1

## 2023-07-24 MED ORDER — SODIUM CHLORIDE (PF) 0.9 % IJ SOLN
INTRAMUSCULAR | Status: AC
  Filled 2023-07-24: qty 30

## 2023-07-24 MED ORDER — SODIUM CHLORIDE 0.9% FLUSH: 3.0000 mL | INTRAVENOUS | Status: DC | PRN

## 2023-07-24 MED ORDER — METOPROLOL SUCCINATE ER 50 MG PO TB24
50.0000 mg | ORAL_TABLET | Freq: Every day | ORAL | Status: DC
Start: 2023-07-25 — End: 2023-07-26
  Administered 2023-07-25 – 2023-07-26 (×2): 50 mg via ORAL
  Filled 2023-07-24 (×2): qty 1

## 2023-07-24 MED ORDER — PHENOL 1.4 % MT LIQD: 1.0000 | OROMUCOSAL | Status: DC | PRN

## 2023-07-24 MED ORDER — LACTATED RINGERS IV SOLN: INTRAVENOUS | Status: DC

## 2023-07-24 MED ORDER — SODIUM CHLORIDE 0.9% FLUSH: 10.0000 mL | Freq: Two times a day (BID) | INTRAVENOUS | Status: DC

## 2023-07-24 MED ORDER — FENTANYL CITRATE PF 50 MCG/ML IJ SOSY
25.0000 ug | PREFILLED_SYRINGE | INTRAMUSCULAR | Status: DC | PRN
Start: 2023-07-24 — End: 2023-07-24

## 2023-07-24 MED ORDER — KETOROLAC TROMETHAMINE 30 MG/ML IJ SOLN
INTRAMUSCULAR | Status: AC
  Filled 2023-07-24: qty 1

## 2023-07-24 MED ORDER — ONDANSETRON HCL 4 MG/2ML IJ SOLN: 4.0000 mg | Freq: Four times a day (QID) | INTRAMUSCULAR | Status: DC | PRN

## 2023-07-24 MED ORDER — SENNA 8.6 MG PO TABS
2.0000 | ORAL_TABLET | Freq: Every day | ORAL | Status: DC
  Administered 2023-07-24 – 2023-07-25 (×2): 17.2 mg via ORAL
  Filled 2023-07-24 (×2): qty 2

## 2023-07-24 MED ORDER — CHLORHEXIDINE GLUCONATE 0.12 % MT SOLN
15.0000 mL | Freq: Once | OROMUCOSAL | Status: AC
Start: 2023-07-24 — End: 2023-07-24
  Administered 2023-07-24: 15 mL via OROMUCOSAL

## 2023-07-24 MED ORDER — ONDANSETRON HCL 4 MG/2ML IJ SOLN: 4.0000 mg | Freq: Once | INTRAMUSCULAR | Status: DC | PRN

## 2023-07-24 MED ORDER — TRANEXAMIC ACID-NACL 1000-0.7 MG/100ML-% IV SOLN
1000.0000 mg | INTRAVENOUS | Status: AC
  Administered 2023-07-24: 1000 mg via INTRAVENOUS
  Filled 2023-07-24: qty 100

## 2023-07-24 MED ORDER — ALBUMIN HUMAN 5 % IV SOLN: INTRAVENOUS | Status: DC | PRN

## 2023-07-24 MED ORDER — OXYCODONE HCL 5 MG PO TABS: 5.0000 mg | ORAL_TABLET | Freq: Once | ORAL | Status: AC | PRN

## 2023-07-24 MED ORDER — LAMOTRIGINE 25 MG PO TABS
150.0000 mg | ORAL_TABLET | Freq: Two times a day (BID) | ORAL | Status: DC
  Administered 2023-07-24 – 2023-07-26 (×4): 150 mg via ORAL
  Filled 2023-07-24 (×4): qty 2

## 2023-07-24 MED ORDER — OXYCODONE HCL 5 MG PO TABS
5.0000 mg | ORAL_TABLET | ORAL | Status: DC | PRN
  Administered 2023-07-24 – 2023-07-25 (×6): 5 mg via ORAL
  Administered 2023-07-26: 10 mg via ORAL
  Filled 2023-07-24: qty 2
  Filled 2023-07-24 (×6): qty 1

## 2023-07-24 MED ORDER — ACETAMINOPHEN 500 MG PO TABS
1000.0000 mg | ORAL_TABLET | Freq: Four times a day (QID) | ORAL | Status: DC
  Administered 2023-07-24 – 2023-07-26 (×8): 1000 mg via ORAL
  Filled 2023-07-24 (×8): qty 2

## 2023-07-24 MED ORDER — BUPIVACAINE-EPINEPHRINE 0.25% -1:200000 IJ SOLN
INTRAMUSCULAR | Status: AC
  Filled 2023-07-24: qty 1

## 2023-07-24 MED ORDER — OXYCODONE HCL 5 MG/5ML PO SOLN: 5.0000 mg | Freq: Once | ORAL | Status: AC | PRN

## 2023-07-24 MED ORDER — PROPOFOL 1000 MG/100ML IV EMUL
INTRAVENOUS | Status: AC
  Filled 2023-07-24: qty 100

## 2023-07-24 MED ORDER — ALUM & MAG HYDROXIDE-SIMETH 200-200-20 MG/5ML PO SUSP: 30.0000 mL | ORAL | Status: DC | PRN

## 2023-07-24 MED ORDER — METHOCARBAMOL 1000 MG/10ML IJ SOLN
500.0000 mg | Freq: Four times a day (QID) | INTRAMUSCULAR | Status: DC | PRN
Start: 2023-07-24 — End: 2023-07-26

## 2023-07-24 MED ORDER — TRANEXAMIC ACID-NACL 1000-0.7 MG/100ML-% IV SOLN
1000.0000 mg | Freq: Once | INTRAVENOUS | Status: AC
  Administered 2023-07-24: 1000 mg via INTRAVENOUS
  Filled 2023-07-24: qty 100

## 2023-07-24 MED ORDER — PROPOFOL 10 MG/ML IV BOLUS
INTRAVENOUS | Status: DC | PRN
  Administered 2023-07-24: 50 mg via INTRAVENOUS

## 2023-07-24 MED ORDER — ENALAPRIL MALEATE 10 MG PO TABS
20.0000 mg | ORAL_TABLET | Freq: Every day | ORAL | Status: DC
  Administered 2023-07-25 – 2023-07-26 (×2): 20 mg via ORAL
  Filled 2023-07-24 (×2): qty 2

## 2023-07-24 MED ORDER — HYDROCHLOROTHIAZIDE 25 MG PO TABS
25.0000 mg | ORAL_TABLET | Freq: Every day | ORAL | Status: DC
  Administered 2023-07-25 – 2023-07-26 (×2): 25 mg via ORAL
  Filled 2023-07-24 (×2): qty 1

## 2023-07-24 MED ORDER — ONDANSETRON HCL 4 MG/2ML IJ SOLN
INTRAMUSCULAR | Status: DC | PRN
  Administered 2023-07-24: 4 mg via INTRAVENOUS

## 2023-07-24 MED ORDER — DIPHENHYDRAMINE HCL 12.5 MG/5ML PO ELIX: 12.5000 mg | ORAL_SOLUTION | ORAL | Status: DC | PRN

## 2023-07-24 MED ORDER — CEFAZOLIN SODIUM-DEXTROSE 2-4 GM/100ML-% IV SOLN
2.0000 g | INTRAVENOUS | Status: AC
  Administered 2023-07-24: 2 g via INTRAVENOUS
  Filled 2023-07-24: qty 100

## 2023-07-24 MED ORDER — ROPIVACAINE HCL 5 MG/ML IJ SOLN
INTRAMUSCULAR | Status: DC | PRN
  Administered 2023-07-24: 20 mL via PERINEURAL

## 2023-07-24 SURGICAL SUPPLY — 46 items
ATTUNE MED ANAT PAT 41 KNEE (Knees) IMPLANT
BAG COUNTER SPONGE SURGICOUNT (BAG) IMPLANT
BAG ZIPLOCK 12X15 (MISCELLANEOUS) IMPLANT
BASEPLATE TIB CMT FB PCKT SZ6 (Knees) IMPLANT
BLADE SAW SGTL 13.0X1.19X90.0M (BLADE) ×1 IMPLANT
BNDG ELASTIC 6INX 5YD STR LF (GAUZE/BANDAGES/DRESSINGS) ×1 IMPLANT
BOWL SMART MIX CTS (DISPOSABLE) ×1 IMPLANT
CEMENT HV SMART SET (Cement) ×2 IMPLANT
COMP FEM CMT ATTUNE 5 LT (Joint) ×1 IMPLANT
COMPONENT FEM CMT ATTUNE 5 LT (Joint) IMPLANT
COVER SURGICAL LIGHT HANDLE (MISCELLANEOUS) ×1 IMPLANT
CUFF TRNQT CYL 34X4.125X (TOURNIQUET CUFF) ×1 IMPLANT
DERMABOND ADVANCED .7 DNX12 (GAUZE/BANDAGES/DRESSINGS) ×1 IMPLANT
DRAPE U-SHAPE 47X51 STRL (DRAPES) ×1 IMPLANT
DRESSING AQUACEL AG SP 3.5X10 (GAUZE/BANDAGES/DRESSINGS) ×1 IMPLANT
DRSG AQUACEL AG SP 3.5X10 (GAUZE/BANDAGES/DRESSINGS) ×1
DURAPREP 26ML APPLICATOR (WOUND CARE) ×2 IMPLANT
ELECT REM PT RETURN 15FT ADLT (MISCELLANEOUS) ×1 IMPLANT
GLOVE BIO SURGEON STRL SZ 6 (GLOVE) ×1 IMPLANT
GLOVE BIOGEL PI IND STRL 6.5 (GLOVE) ×1 IMPLANT
GLOVE BIOGEL PI IND STRL 7.5 (GLOVE) ×1 IMPLANT
GLOVE ORTHO TXT STRL SZ7.5 (GLOVE) ×2 IMPLANT
GOWN STRL REUS W/ TWL LRG LVL3 (GOWN DISPOSABLE) ×2 IMPLANT
HOLDER FOLEY CATH W/STRAP (MISCELLANEOUS) IMPLANT
INSERT MED ATTUNE KNEE 5 6 LT (Insert) IMPLANT
KIT TURNOVER KIT A (KITS) IMPLANT
MANIFOLD NEPTUNE II (INSTRUMENTS) ×1 IMPLANT
NDL SAFETY ECLIPSE 18X1.5 (NEEDLE) IMPLANT
NS IRRIG 1000ML POUR BTL (IV SOLUTION) ×1 IMPLANT
PACK TOTAL KNEE CUSTOM (KITS) ×1 IMPLANT
PAD COLD SHLDR WRAP-ON (PAD) IMPLANT
PIN FIX SIGMA LCS THRD HI (PIN) IMPLANT
PROTECTOR NERVE ULNAR (MISCELLANEOUS) ×1 IMPLANT
SET HNDPC FAN SPRY TIP SCT (DISPOSABLE) ×1 IMPLANT
SET PAD KNEE POSITIONER (MISCELLANEOUS) ×1 IMPLANT
SPIKE FLUID TRANSFER (MISCELLANEOUS) ×2 IMPLANT
SUT MNCRL AB 4-0 PS2 18 (SUTURE) ×1 IMPLANT
SUT STRATAFIX PDS+ 0 24IN (SUTURE) ×1 IMPLANT
SUT VIC AB 1 CT1 36 (SUTURE) ×1 IMPLANT
SUT VIC AB 2-0 CT1 TAPERPNT 27 (SUTURE) ×2 IMPLANT
SYR 3ML LL SCALE MARK (SYRINGE) ×1 IMPLANT
TOWEL GREEN STERILE FF (TOWEL DISPOSABLE) ×1 IMPLANT
TRAY FOLEY MTR SLVR 16FR STAT (SET/KITS/TRAYS/PACK) ×1 IMPLANT
TUBE SUCTION HIGH CAP CLEAR NV (SUCTIONS) ×1 IMPLANT
WATER STERILE IRR 1000ML POUR (IV SOLUTION) ×2 IMPLANT
WRAP KNEE MAXI GEL POST OP (GAUZE/BANDAGES/DRESSINGS) ×1 IMPLANT

## 2023-07-24 NOTE — Anesthesia Procedure Notes (Signed)
 Anesthesia Regional Block: Adductor canal block   Pre-Anesthetic Checklist: , timeout performed,  Correct Patient, Correct Site, Correct Laterality,  Correct Procedure, Correct Position, site marked,  Risks and benefits discussed,  Surgical consent,  Pre-op evaluation,  At surgeon's request and post-op pain management  Laterality: Left  Prep: Maximum Sterile Barrier Precautions used, chloraprep       Needles:  Injection technique: Single-shot  Needle Type: Echogenic Needle      Needle Gauge: 20     Additional Needles:   Procedures:,,,, ultrasound used (permanent image in chart),,    Narrative:  Start time: 07/24/2023 10:15 AM End time: 07/24/2023 10:20 AM Injection made incrementally with aspirations every 5 mL.  Performed by: Personally  Anesthesiologist: Keneth Lynwood POUR, MD

## 2023-07-24 NOTE — Interval H&P Note (Signed)
 History and Physical Interval Note:  07/24/2023 10:28 AM  George Rojas  has presented today for surgery, with the diagnosis of lt knee ostoarthritis.  The various methods of treatment have been discussed with the patient and family. After consideration of risks, benefits and other options for treatment, the patient has consented to  Procedure(s): TOTAL KNEE ARTHROPLASTY (Left) as a surgical intervention.  The patient's history has been reviewed, patient examined, no change in status, stable for surgery.  I have reviewed the patient's chart and labs.  Questions were answered to the patient's satisfaction.     [name]

## 2023-07-24 NOTE — Plan of Care (Signed)
  Problem: Activity: Goal: Risk for activity intolerance will decrease Outcome: Progressing   Problem: Pain Management: Goal: General experience of comfort will improve Outcome: Progressing   Problem: Safety: Goal: Ability to remain free from injury will improve Outcome: Progressing

## 2023-07-24 NOTE — TOC Transition Note (Addendum)
 Transition of Care [facility]) - Discharge Note   Patient Details  Name: George Rojas MRN: [id number] Date of Birth: [birth year]  [facility] CM/SW Contact:  NORMAN ASPEN, LCSW Phone Number: 07/24/2023, 3:14 PM   Clinical Narrative:     Met with pt who reports he has borrowed RW from friend.  Agreeable with plan for HHPT but no HH agency preference.  Referral placed with [facility].  No further TOC needs.  Final next level of care: Home w Home Health Services Barriers to Discharge: No Barriers Identified   Patient Goals and CMS Choice Patient states their goals for this hospitalization and ongoing recovery are:: return home          Discharge Placement                       Discharge Plan and Services Additional resources added to the After Visit Summary for                  DME Arranged: N/A DME Agency: NA       HH Arranged: PT HH Agency: [facility] Date [facility] Agency Contacted: 07/24/23 Time HH Agency Contacted: 1501 Representative spoke with at [facility] Agency: Cindie  Social Drivers of Health (SDOH) Interventions SDOH Screenings   Food Insecurity: No Food Insecurity (07/24/2023)  Housing: Unknown (07/24/2023)  Transportation Needs: No Transportation Needs (07/24/2023)  Utilities: Not At Risk (07/24/2023)  Depression (PHQ2-9): Low Risk  (08/01/2018)  Social Connections: Unknown (07/24/2023)  Tobacco Use: Unknown (07/24/2023)     Readmission Risk Interventions     No data to display

## 2023-07-24 NOTE — Anesthesia Procedure Notes (Signed)
 Date/Time: 07/24/2023 11:22 AM  Performed by: Maurene Capes, CRNAOxygen Delivery Method: Simple face mask Placement Confirmation: positive ETCO2 Dental Injury: Teeth and Oropharynx as per pre-operative assessment

## 2023-07-24 NOTE — Op Note (Signed)
 NAME:  George Rojas                      MEDICAL RECORD NO.:  [id number]                             FACILITY:  [facility]      PHYSICIAN:  Donnice BIRCH. Ernie, M.D.  DATE OF BIRTH:  04-28-1956      DATE OF PROCEDURE:  07/24/2023                                     OPERATIVE REPORT         PREOPERATIVE DIAGNOSIS:  Left knee osteoarthritis.      POSTOPERATIVE DIAGNOSIS:  Left knee osteoarthritis.      FINDINGS:  The patient was noted to have complete loss of cartilage and   bone-on-bone arthritis with associated osteophytes in the lateral and patellofemoral compartments of   the knee with a significant synovitis and associated effusion.  The patient had failed months of conservative treatment including medications, injection therapy, activity modification.     PROCEDURE:  Left total knee replacement.      COMPONENTS USED:  DePuy Attune FB CR MS knee   system, a size 5 femur, 6 tibia, size 6 mm CR MS AOX insert, and 41 anatomic patellar   button.      SURGEON:  Donnice BIRCH. Ernie, M.D.      ASSISTANT:  Rosina Calin, PA-C.      ANESTHESIA:  Regional and Spinal.      SPECIMENS:  None.      COMPLICATION:  None.      DRAINS:  None.  EBL: <200 cc      TOURNIQUET TIME:  27 min at 225 mmHg     The patient was stable to the recovery room.      INDICATION FOR PROCEDURE:  George Rojas is a 67 y.o. male patient of   mine.  The patient had been seen, evaluated, and treated for months conservatively in the   office with medication, activity modification, and injections.  The patient had   radiographic changes of bone-on-bone arthritis with endplate sclerosis and osteophytes noted.  Based on the radiographic changes and failed conservative measures, the patient   decided to proceed with definitive treatment, total knee replacement.  Risks of infection, DVT, component failure, need for revision surgery, neurovascular injury were reviewed in the office setting.  The postop course was  reviewed stressing the efforts to maximize post-operative satisfaction and function.  Consent was obtained for benefit of pain   relief.      PROCEDURE IN DETAIL:  The patient was brought to the operative theater.   Once adequate anesthesia, preoperative antibiotics, 2 gm of Ancef ,1 gm of Tranexamic Acid , and 10 mg of Decadron  administered, the patient was positioned supine with a left thigh tourniquet placed.  The  left lower extremity was prepped and draped in sterile fashion.  A time-   out was performed identifying the patient, planned procedure, and the appropriate extremity.      The left lower extremity was placed in the [facility] leg holder.  The leg was   exsanguinated, tourniquet elevated to 225 mmHg.  A midline incision was   made followed by median parapatellar arthrotomy.  Following initial   exposure, attention was  first directed to the patella.  Precut   measurement was noted to be 24 mm.  I resected down to 14 mm and used a   41 anatomic patellar button to restore patellar height as well as cover the cut surface.      The lug holes were drilled and a metal shim was placed to protect the   patella from retractors and saw blade during the procedure.      At this point, attention was now directed to the femur.  The femoral   canal was opened with a drill, irrigated to try to prevent fat emboli.  An   intramedullary rod was passed at 5 degrees valgus, 9 mm of bone was   resected off the distal femur.  Following this resection, the tibia was   subluxated anteriorly.  Using the extramedullary guide, 3 mm of bone was resected off   the proximal medial tibia.  We confirmed the gap would be   stable medially and laterally with a size 5 spacer block as well as confirmed that the tibial cut was perpendicular in the coronal plane, checking with an alignment rod.      Once this was done, I sized the femur to be a size 5 in the anterior-   posterior dimension, chose a standard component  based on medial and   lateral dimension.  The size 5 rotation block was then pinned in   position anterior referenced using the C-clamp to set rotation.  The   anterior, posterior, and  chamfer cuts were made without difficulty nor   notching making certain that I was along the anterior cortex to help   with flexion gap stability.      The final box cut was made off the lateral aspect of distal femur.      At this point, the tibia was sized to be a size 6.  The size 6 tray was   then pinned in position through the medial third of the tubercle,   drilled, and keel punched.  Trial reduction was now carried with a 5 femur,  6 tibia, a size 6 mm CR MS insert, and the 41 anatomic patella botton.  The knee was brought to full extension with good flexion stability with the patella   tracking through the trochlea without application of pressure.  Given   all these findings the trial components removed.  Final components were   opened and cement was mixed.  The knee was irrigated with normal saline solution and pulse lavage.  The synovial lining was   then injected with 30 cc of 0.25% Marcaine  with epinephrine , 1 cc of Toradol  and 30 cc of NS for a total of 61 cc.     Final implants were then cemented onto cleaned and dried cut surfaces of bone with the knee brought to extension with a size 6 mm CR MS trial insert.      Once the cement had fully cured, excess cement was removed   throughout the knee.  I confirmed that I was satisfied with the range of   motion and stability, and the final size 6 mm CR MS AOX insert was chosen.  It was   placed into the knee.      The tourniquet had been let down at 27 minutes.  No significant   hemostasis was required.  The extensor mechanism was then reapproximated using #1 Vicryl and #1 Stratafix sutures with the knee   in flexion.  The   remaining wound was closed with 2-0 Vicryl and running 4-0 Monocryl.   The knee was cleaned, dried, dressed sterilely using  Dermabond and   Aquacel dressing.  The patient was then   brought to recovery room in stable condition, tolerating the procedure   well.   Please note that Physician Assistant, Rosina Calin, PA-C was present for the entirety of the case, and was utilized for pre-operative positioning, peri-operative retractor management, general facilitation of the procedure and for primary wound closure at the end of the case.              Donnice CORDOBA Ernie, M.D.    07/24/2023 10:48 AM

## 2023-07-24 NOTE — Anesthesia Procedure Notes (Signed)
 Spinal  Patient location during procedure: OR Start time: 07/24/2023 11:18 AM End time: 07/24/2023 11:22 AM Reason for block: surgical anesthesia Staffing Anesthesiologist: Keneth Lynwood POUR, MD Performed by: Keneth Lynwood POUR, MD Authorized by: Keneth Lynwood POUR, MD   Preanesthetic Checklist Completed: patient identified, IV checked, site marked, risks and benefits discussed, surgical consent, monitors and equipment checked, pre-op evaluation and timeout performed Spinal Block Patient position: sitting Prep: DuraPrep Patient monitoring: heart rate, cardiac monitor, continuous pulse ox and blood pressure Approach: midline Location: L3-4 Injection technique: single-shot Needle Needle type: Sprotte  Needle gauge: 24 G Needle length: 9 cm Assessment Sensory level: T4 Events: CSF return

## 2023-07-24 NOTE — Transfer of Care (Signed)
 Immediate Anesthesia Transfer of Care Note  Patient: George Rojas  Procedure(s) Performed: TOTAL KNEE ARTHROPLASTY (Left: Knee)  Patient Location: PACU  Anesthesia Type:Spinal and MAC combined with regional for post-op pain  Level of Consciousness: drowsy and patient cooperative  Airway & Oxygen Therapy: Patient Spontanous Breathing and Patient connected to nasal cannula oxygen  Post-op Assessment: Report given to RN and Post -op Vital signs reviewed and stable  Post vital signs: Reviewed and stable  Last Vitals:  Vitals Value Taken Time  BP 109/56 07/24/23 1251  Temp    Pulse 78 07/24/23 1254  Resp 17 07/24/23 1254  SpO2 93 % 07/24/23 1254  Vitals shown include unfiled device data.  Last Pain:  Vitals:   07/24/23 0940  TempSrc: Oral  PainSc: 0-No pain         Complications: No notable events documented.

## 2023-07-24 NOTE — Evaluation (Signed)
 Physical Therapy Evaluation Patient Details Name: George Rojas MRN: [id number] DOB: 1955-11-15 Today's Date: 07/24/2023  History of Present Illness  67 y.o. male admitted 07/24/23 for L TKA. PMH: L temporal lobe removal, short term memory deficits, duodenal cancer, HTN  Clinical Impression  Pt is s/p TKA resulting in the deficits listed below (see PT Problem List).  Pt reports minimal pain, amb 50' with RW and min assist. HEP initiated. Anticipate steady progress in acute setting  Pt will benefit from acute skilled PT to increase their independence and safety with mobility to allow discharge.          If plan is discharge home, recommend the following: A little help with walking and/or transfers;Help with stairs or ramp for entrance;A little help with bathing/dressing/bathroom;Assistance with cooking/housework;Assist for transportation   Can travel by private vehicle        Equipment Recommendations None recommended by PT (borrowed RW)  Recommendations for Other Services       Functional Status Assessment Patient has had a recent decline in their functional status and demonstrates the ability to make significant improvements in function in a reasonable and predictable amount of time.     Precautions / Restrictions Precautions Precautions: Fall;Knee Restrictions Weight Bearing Restrictions Per Provider Order: No Other Position/Activity Restrictions: WBAT      Mobility  Bed Mobility Overal bed mobility: Needs Assistance Bed Mobility: Supine to Sit     Supine to sit: Supervision, HOB elevated     General bed mobility comments: incr time    Transfers Overall transfer level: Needs assistance Equipment used: Rolling walker (2 wheels) Transfers: Sit to/from Stand Sit to Stand: Min assist           General transfer comment: cues for hand placement and LLE position    Ambulation/Gait Ambulation/Gait assistance: Contact guard assist, Min assist Gait  Distance (Feet): 50 Feet Assistive device: Rolling walker (2 wheels) Gait Pattern/deviations: Step-to pattern       General Gait Details: cues for sequence, RW position  Stairs            Wheelchair Mobility     Tilt Bed    Modified Rankin (Stroke Patients Only)       Balance                                             Pertinent Vitals/Pain Pain Assessment Pain Assessment: 0-10 Pain Score: 2  Pain Location: left knee Pain Descriptors / Indicators: Discomfort Pain Intervention(s): Limited activity within patient's tolerance, Monitored during session, Repositioned    Home Living Family/patient expects to be discharged to:: Private residence Living Arrangements: Alone Available Help at Discharge: Friend(s) Type of Home: House Home Access: Stairs to enter Entrance Stairs-Rails: Doctor, General Practice of Steps: 6-8   Home Layout: One level Home Equipment: Agricultural Consultant (2 wheels)      Prior Function Prior Level of Function : Independent/Modified Independent             Mobility Comments: independent       Extremity/Trunk Assessment   Upper Extremity Assessment Upper Extremity Assessment: Overall WFL for tasks assessed    Lower Extremity Assessment Lower Extremity Assessment: LLE deficits/detail LLE Deficits / Details: ankle WFL, knee extension and hip flexion 3/5       Communication      Cognition Arousal: Alert Behavior During Therapy: [facility]  for tasks assessed/performed Overall Cognitive Status: Within Functional Limits for tasks assessed                                          General Comments      Exercises Total Joint Exercises Ankle Circles/Pumps: AROM, Both, 5 reps Quad Sets: AROM, Left (3 reps)   Assessment/Plan    PT Assessment Patient needs continued PT services  PT Problem List Decreased strength;Decreased range of motion;Decreased activity tolerance;Decreased  balance;Decreased mobility;Decreased knowledge of precautions;Decreased knowledge of use of DME       PT Treatment Interventions DME instruction;Gait training;Functional mobility training;Therapeutic activities;Therapeutic exercise;Patient/family education;Stair training    PT Goals (Current goals can be found in the Care Plan section)  Acute Rehab PT Goals PT Goal Formulation: With patient Time For Goal Achievement: 07/31/23 Potential to Achieve Goals: Good    Frequency 7X/week     Co-evaluation               AM-PAC PT 6 Clicks Mobility  Outcome Measure Help needed turning from your back to your side while in a flat bed without using bedrails?: A Little Help needed moving from lying on your back to sitting on the side of a flat bed without using bedrails?: A Little Help needed moving to and from a bed to a chair (including a wheelchair)?: A Little Help needed standing up from a chair using your arms (e.g., wheelchair or bedside chair)?: A Little Help needed to walk in hospital room?: A Little Help needed climbing 3-5 steps with a railing? : A Little 6 Click Score: 18    End of Session Equipment Utilized During Treatment: Gait belt Activity Tolerance: Patient tolerated treatment well Patient left: with call bell/phone within reach;in chair;with chair alarm set   PT Visit Diagnosis: Other abnormalities of gait and mobility (R26.89);Difficulty in walking, not elsewhere classified (R26.2)    Time: 8353-8291 PT Time Calculation (min) (ACUTE ONLY): 22 min   Charges:   PT Evaluation $PT Eval Low Complexity: 1 Low   PT General Charges $$ ACUTE PT VISIT: 1 Visit         Keian Odriscoll, PT  Acute Rehab Dept [facility]) [phone]  07/24/2023   [facility] 07/24/2023, 5:20 PM

## 2023-07-24 NOTE — Anesthesia Procedure Notes (Signed)
 Procedure Name: MAC Date/Time: 07/24/2023 11:14 AM  Performed by: Landy Chip HERO, CRNAPre-anesthesia Checklist: Patient identified, Emergency Drugs available, Suction available, Patient being monitored and Timeout performed Patient Re-evaluated:Patient Re-evaluated prior to induction Oxygen Delivery Method: Nasal cannula Preoxygenation: Pre-oxygenation with 100% oxygen Induction Type: IV induction Placement Confirmation: positive ETCO2 Dental Injury: Teeth and Oropharynx as per pre-operative assessment

## 2023-07-24 NOTE — Discharge Instructions (Addendum)
 INSTRUCTIONS AFTER JOINT REPLACEMENT   Pain Regimen Instructions:  - Take tylenol  1000 mg every 6-8 hours around the clock  - Take the muscle relaxant every 6 hours    - Then take 5 mg (1 tablet) of oxycodone  every 4 hours as needed for severe pain  - Ice and elevate your leg as often as you can  Do not take over the counter pain medication until instructed by us .  If this is not controlling your pain, or you need refills - call our office at [phone] or send a message via the [employer] portal.   Take the stool softeners provided until you are having regular bowel movements, and then you may discontinue.    Remove items at home which could result in a fall. This includes throw rugs or furniture in walking pathways ICE to the affected joint every three hours while awake for 30 minutes at a time, for at least the first 3-5 days, and then as needed for pain and swelling.  Continue to use ice for pain and swelling. You may notice swelling that will progress down to the foot and ankle.  This is normal after surgery.  Elevate your leg when you are not up walking on it.   Continue to use the breathing machine you got in the hospital (incentive spirometer) which will help keep your temperature down.  It is common for your temperature to cycle up and down following surgery, especially at night when you are not up moving around and exerting yourself.  The breathing machine keeps your lungs expanded and your temperature down.   DIET:  As you were doing prior to hospitalization, we recommend a well-balanced diet.  DRESSING / WOUND CARE / SHOWERING  Keep the surgical dressing until follow up.  The dressing is water  proof, so you can shower without any extra covering.  IF THE DRESSING FALLS OFF or the wound gets wet inside, change the dressing with sterile gauze.  Please use good hand washing techniques before changing the dressing.  Do not use any lotions or creams on the incision until instructed by  your surgeon.    ACTIVITY  Increase activity slowly as tolerated, but follow the weight bearing instructions below.   No driving for 6 weeks or until further direction given by your physician.  You cannot drive while taking narcotics.  No lifting or carrying greater than 10 lbs. until further directed by your surgeon. Avoid periods of inactivity such as sitting longer than an hour when not asleep. This helps prevent blood clots.  You may return to work once you are authorized by your doctor.     WEIGHT BEARING   Weight bearing as tolerated with assist device (walker, cane, etc) as directed, use it as long as suggested by your surgeon or therapist, typically at least 4-6 weeks.   EXERCISES  Results after joint replacement surgery are often greatly improved when you follow the exercise, range of motion and muscle strengthening exercises prescribed by your doctor. Safety measures are also important to protect the joint from further injury. Any time any of these exercises cause you to have increased pain or swelling, decrease what you are doing until you are comfortable again and then slowly increase them. If you have problems or questions, call your caregiver or physical therapist for advice.   Rehabilitation is important following a joint replacement. After just a few days of immobilization, the muscles of the leg can become weakened and shrink (atrophy).  These  exercises are designed to build up the tone and strength of the thigh and leg muscles and to improve motion. Often times heat used for twenty to thirty minutes before working out will loosen up your tissues and help with improving the range of motion but do not use heat for the first two weeks following surgery (sometimes heat can increase post-operative swelling).   These exercises can be done on a training (exercise) mat, on the floor, on a table or on a bed. Use whatever works the best and is most comfortable for you.    Use music or  television while you are exercising so that the exercises are a pleasant break in your day. This will make your life better with the exercises acting as a break in your routine that you can look forward to.   Perform all exercises about fifteen times, three times per day or as directed.  You should exercise both the operative leg and the other leg as well.  Exercises include:   Quad Sets - Tighten up the muscle on the front of the thigh (Quad) and hold for 5-10 seconds.   Straight Leg Raises - With your knee straight (if you were given a brace, keep it on), lift the leg to 60 degrees, hold for 3 seconds, and slowly lower the leg.  Perform this exercise against resistance later as your leg gets stronger.  Leg Slides: Lying on your back, slowly slide your foot toward your buttocks, bending your knee up off the floor (only go as far as is comfortable). Then slowly slide your foot back down until your leg is flat on the floor again.  Angel Wings: Lying on your back spread your legs to the side as far apart as you can without causing discomfort.  Hamstring Strength:  Lying on your back, push your heel against the floor with your leg straight by tightening up the muscles of your buttocks.  Repeat, but this time bend your knee to a comfortable angle, and push your heel against the floor.  You may put a pillow under the heel to make it more comfortable if necessary.   A rehabilitation program following joint replacement surgery can speed recovery and prevent re-injury in the future due to weakened muscles. Contact your doctor or a physical therapist for more information on knee rehabilitation.    CONSTIPATION  Constipation is defined medically as fewer than three stools per week and severe constipation as less than one stool per week.  Even if you have a regular bowel pattern at home, your normal regimen is likely to be disrupted due to multiple reasons following surgery.  Combination of anesthesia,  postoperative narcotics, change in appetite and fluid intake all can affect your bowels.   YOU MUST use at least one of the following options; they are listed in order of increasing strength to get the job done.  They are all available over the counter, and you may need to use some, POSSIBLY even all of these options:    Drink plenty of fluids (prune juice may be helpful) and high fiber foods Colace 100 mg by mouth twice a day  Senokot for constipation as directed and as needed Dulcolax (bisacodyl ), take with full glass of water   Miralax  (polyethylene glycol) once or twice a day as needed.  If you have tried all these things and are unable to have a bowel movement in the first 3-4 days after surgery call either your surgeon or your primary doctor.  If you experience loose stools or diarrhea, hold the medications until you stool forms back up.  If your symptoms do not get better within 1 week or if they get worse, check with your doctor.  If you experience the worst abdominal pain ever or develop nausea or vomiting, please contact the office immediately for further recommendations for treatment.   ITCHING:  If you experience itching with your medications, try taking only a single pain pill, or even half a pain pill at a time.  You can also use Benadryl  over the counter for itching or also to help with sleep.   TED HOSE STOCKINGS:  Use stockings on both legs until for at least 2 weeks or as directed by physician office. They may be removed at night for sleeping.  MEDICATIONS:  See your medication summary on the "After Visit Summary" that nursing will review with you.  You may have some home medications which will be placed on hold until you complete the course of blood thinner medication.  It is important for you to complete the blood thinner medication as prescribed.  PRECAUTIONS:  If you experience chest pain or shortness of breath - call 911 immediately for transfer to the hospital emergency  department.   If you develop a fever greater that 101 F, purulent drainage from wound, increased redness or drainage from wound, foul odor from the wound/dressing, or calf pain - CONTACT YOUR SURGEON.                                                   FOLLOW-UP APPOINTMENTS:  If you do not already have a post-op appointment, please call the office for an appointment to be seen by your surgeon.  Guidelines for how soon to be seen are listed in your "After Visit Summary", but are typically between 1-4 weeks after surgery.  OTHER INSTRUCTIONS:   Knee Replacement:  Do not place pillow under knee, focus on keeping the knee straight while resting. CPM instructions: 0-90 degrees, 2 hours in the morning, 2 hours in the afternoon, and 2 hours in the evening. Place foam block, curve side up under heel at all times except when in CPM or when walking.  DO NOT modify, tear, cut, or change the foam block in any way.  POST-OPERATIVE OPIOID TAPER INSTRUCTIONS: It is important to wean off of your opioid medication as soon as possible. If you do not need pain medication after your surgery it is ok to stop day one. Opioids include: Codeine, Hydrocodone(Norco, Vicodin), Oxycodone (Percocet, oxycontin ) and hydromorphone  amongst others.  Long term and even short term use of opiods can cause: Increased pain response Dependence Constipation Depression Respiratory depression And more.  Withdrawal symptoms can include Flu like symptoms Nausea, vomiting And more Techniques to manage these symptoms Hydrate well Eat regular healthy meals Stay active Use relaxation techniques(deep breathing, meditating, yoga) Do Not substitute Alcohol to help with tapering If you have been on opioids for less than two weeks and do not have pain than it is ok to stop all together.  Plan to wean off of opioids This plan should start within one week post op of your joint replacement. Maintain the same interval or time between taking  each dose and first decrease the dose.  Cut the total daily intake of opioids by one tablet each day Next  start to increase the time between doses. The last dose that should be eliminated is the evening dose.   MAKE SURE YOU:  Understand these instructions.  Get help right away if you are not doing well or get worse.    Thank you for letting us  be a part of your medical care team.  It is a privilege we respect greatly.  We hope these instructions will help you stay on track for a fast and full recovery!

## 2023-07-24 NOTE — Anesthesia Preprocedure Evaluation (Addendum)
 Anesthesia Evaluation  Patient identified by MRN, date of birth, ID band Patient awake    Reviewed: Allergy & Precautions, NPO status , Patient's Chart, lab work & pertinent test results, reviewed documented beta blocker date and time   History of Anesthesia Complications Negative for: history of anesthetic complications  Airway Mallampati: III  TM Distance: >3 FB     Dental no notable dental hx. (+) Partial Lower   Pulmonary asthma    breath sounds clear to auscultation       Cardiovascular hypertension, (-) CAD, (-) Past MI, (-) Cardiac Stents and (-) CABG  Rhythm:Regular Rate:Normal     Neuro/Psych Seizures -,     GI/Hepatic ,neg GERD  ,,(+) neg Cirrhosis        Endo/Other  diabetesHypothyroidism    Renal/GU Renal disease     Musculoskeletal  (+) Arthritis ,    Abdominal   Peds  Hematology   Anesthesia Other Findings   Reproductive/Obstetrics                             Anesthesia Physical Anesthesia Plan  ASA: 2  Anesthesia Plan: Spinal   Post-op Pain Management: Regional block*   Induction: Intravenous  PONV Risk Score and Plan: 1 and Ondansetron   Airway Management Planned:   Additional Equipment:   Intra-op Plan:   Post-operative Plan:   Informed Consent: I have reviewed the patients History and Physical, chart, labs and discussed the procedure including the risks, benefits and alternatives for the proposed anesthesia with the patient or authorized representative who has indicated his/her understanding and acceptance.     Dental advisory given  Plan Discussed with: CRNA  Anesthesia Plan Comments:        Anesthesia Quick Evaluation

## 2023-07-24 NOTE — H&P (Signed)
 TOTAL KNEE ADMISSION H&P  Patient is being admitted for left total knee arthroplasty.  Therapy Plans: HHPT (did not make plans for outpatient yet) - Will need HHPT set up by social work  Disposition: Home with Ronal (friend) Planned DVT Prophylaxis: Plavix  DME needed: walker, CTU PCP: Dr. Jana - telephone visit next week Cardio: Dr. Eloy - clearance received (hx of stents) TXA: IV Allergies: phenobarbital Anesthesia Concerns: none BMI: 31.3 Last HgbA1c: ~5%   Other: - Ice machine at hospital* - History of left temporal lobe removal - short term memory is very limited, had to relearn to walk/talk/etc - has mild expressive aphasia - [facility] about HHPT due to cost of OPPT - hx of duodenal cancer - s/p surgical excision  Subjective:  Chief Complaint:left knee pain.  HPI: George Rojas, 67 y.o. male, has a history of pain and functional disability in the left knee due to arthritis and has failed non-surgical conservative treatments for greater than 12 weeks to includeNSAID's and/or analgesics, corticosteriod injections, and activity modification.  Onset of symptoms was gradual, starting 2 years ago with gradually worsening course since that time. The patient noted no past surgery on the left knee(s).  Patient currently rates pain in the left knee(s) at 9 out of 10 with activity. Patient has worsening of pain with activity and weight bearing, pain that interferes with activities of daily living, and pain with passive range of motion.  Patient has evidence of joint space narrowing by imaging studies.  There is no active infection.  There are no active problems to display for this patient.  Past Medical History:  Diagnosis Date   Arthritis    Asthma    Cancer (HCC)    duodenal   Diabetes mellitus without complication (HCC)    Hypertension    Hypothyroidism    Seizures (HCC)     Past Surgical History:  Procedure Laterality Date   BRAIN SURGERY      No current  facility-administered medications for this encounter.   Current Outpatient Medications  Medication Sig Dispense Refill Last Dose/Taking   albuterol  (PROVENTIL  HFA;VENTOLIN  HFA) 108 (90 BASE) MCG/ACT inhaler Inhale 1-2 puffs into the lungs every 6 (six) hours as needed for wheezing or shortness of breath. 1 Inhaler 0 Taking As Needed   atorvastatin  (LIPITOR) 80 MG tablet Take 80 mg by mouth in the morning.   Taking   carbamazepine  (TEGRETOL ) 200 MG tablet Take 200 mg by mouth 2 (two) times daily.    Taking   CINNAMON PO Take 1,000 mg by mouth in the morning.   Taking   clopidogrel  (PLAVIX ) 75 MG tablet Take 75 mg by mouth at bedtime.   Taking   diclofenac (VOLTAREN) 75 MG EC tablet Take 75 mg by mouth in the morning.   Taking   enalapril  (VASOTEC ) 20 MG tablet Take 20 mg by mouth daily with breakfast.    Taking   gabapentin  (NEURONTIN ) 300 MG capsule Take 300 mg by mouth at bedtime.   Taking   glipiZIDE  (GLUCOTROL ) 5 MG tablet Take 5 mg by mouth at bedtime.   Taking   hydrochlorothiazide  (HYDRODIURIL ) 25 MG tablet Take 25 mg by mouth daily with breakfast.    Taking   lamoTRIgine  (LAMICTAL ) 150 MG tablet Take 150 mg by mouth 2 (two) times daily.   Taking   levothyroxine  (SYNTHROID ) 100 MCG tablet Take 100 mcg by mouth daily before breakfast.   Taking   metoprolol  succinate (TOPROL -XL) 50 MG 24 hr tablet Take  50 mg by mouth in the morning.   Taking   rOPINIRole  (REQUIP ) 0.5 MG tablet Take 0.5 mg by mouth at bedtime.   Taking   sertraline  (ZOLOFT ) 25 MG tablet Take 25 mg by mouth in the morning.   Taking   terazosin  (HYTRIN ) 1 MG capsule Take 1 mg by mouth at bedtime.   Taking   Allergies  Allergen Reactions   Phenobarbital Other (See Comments)    Increases seizure activity.     Social History   Tobacco Use   Smoking status: Never   Smokeless tobacco: Not on file  Substance Use Topics   Alcohol use: No    No family history on file.   Review of Systems  Constitutional:  Negative for  chills and fever.  Respiratory:  Negative for cough and shortness of breath.   Cardiovascular:  Negative for chest pain.  Gastrointestinal:  Negative for nausea and vomiting.  Musculoskeletal:  Positive for arthralgias.     Objective:  Physical Exam Well nourished and well developed. General: Alert and oriented x3, cooperative and pleasant, no acute distress. Head: normocephalic, atraumatic, neck supple. Eyes: EOMI.  Musculoskeletal: Bilateral knee exams: No palpable effusions, warmth erythema Neutral to slight valgus bilateral knees with passively correctable valgus Slight flexion contractures with flexion of 110 degrees with some tightness and crepitation of anterior lateral aspect the k  Calves soft and nontender. Motor function intact in LE. Strength 5/5 LE bilaterally. Neuro: Distal pulses 2+. Sensation to light touch intact in LE.  Vital signs in last 24 hours:    Labs:   Estimated body mass index is 31.38 kg/m as calculated from the following:   Height as of 07/12/23: 5' 11 (1.803 m).   Weight as of 07/12/23: 102.1 kg.   Imaging Review Plain radiographs demonstrate severe degenerative joint disease of the left knee(s). The overall alignment isneutral. The bone quality appears to be adequate for age and reported activity level.      Assessment/Plan:  End stage arthritis, left knee   The patient history, physical examination, clinical judgment of the provider and imaging studies are consistent with end stage degenerative joint disease of the left knee(s) and total knee arthroplasty is deemed medically necessary. The treatment options including medical management, injection therapy arthroscopy and arthroplasty were discussed at length. The risks and benefits of total knee arthroplasty were presented and reviewed. The risks due to aseptic loosening, infection, stiffness, patella tracking problems, thromboembolic complications and other imponderables were discussed.  The patient acknowledged the explanation, agreed to proceed with the plan and consent was signed. Patient is being admitted for inpatient treatment for surgery, pain control, PT, OT, prophylactic antibiotics, VTE prophylaxis, progressive ambulation and ADL's and discharge planning. The patient is planning to be discharged  home.     Patient's anticipated LOS is less than 2 midnights, meeting these requirements: - Younger than 42 - Lives within 1 hour of care - Has a competent adult at home to recover with post-op recover - NO history of  - Chronic pain requiring opiods  - Diabetes  - Coronary Artery Disease  - Heart failure  - Heart attack  - Stroke  - DVT/VTE  - Cardiac arrhythmia  - Respiratory Failure/COPD  - Renal failure  - Anemia  - Advanced Liver disease  Rosina Calin, PA-C Orthopedic Surgery [facility] [place] [phone]

## 2023-07-24 NOTE — Anesthesia Postprocedure Evaluation (Signed)
 Anesthesia Post Note  Patient: George Rojas  Procedure(s) Performed: TOTAL KNEE ARTHROPLASTY (Left: Knee)     Patient location during evaluation: PACU Anesthesia Type: Spinal Level of consciousness: oriented and awake and alert Pain management: pain level controlled Vital Signs Assessment: post-procedure vital signs reviewed and stable Respiratory status: spontaneous breathing, respiratory function stable and patient connected to nasal cannula oxygen Cardiovascular status: blood pressure returned to baseline and stable Postop Assessment: no headache, no backache and no apparent nausea or vomiting Anesthetic complications: no   No notable events documented.  Last Vitals:  Vitals:   07/24/23 1415 07/24/23 1429  BP: 128/75 133/72  Pulse: 66 72  Resp: 15 15  Temp: 36.6 C 36.5 C  SpO2: 97% 95%    Last Pain:  Vitals:   07/24/23 1429  TempSrc: Oral  PainSc: 0-No pain                 Lynwood MARLA Cornea

## 2023-07-25 ENCOUNTER — Encounter (HOSPITAL_COMMUNITY): Payer: Self-pay | Admitting: Orthopedic Surgery

## 2023-07-25 DIAGNOSIS — M1712 Unilateral primary osteoarthritis, left knee: Secondary | ICD-10-CM | POA: Diagnosis not present

## 2023-07-25 LAB — GLUCOSE, CAPILLARY
Glucose-Capillary: 156 mg/dL — ABNORMAL HIGH (ref 70–99)
Glucose-Capillary: 226 mg/dL — ABNORMAL HIGH (ref 70–99)
Glucose-Capillary: 213 mg/dL — ABNORMAL HIGH (ref 70–99)
Glucose-Capillary: 214 mg/dL — ABNORMAL HIGH (ref 70–99)

## 2023-07-25 LAB — CBC
HCT: 37.4 % — ABNORMAL LOW (ref 39.0–52.0)
Hemoglobin: 12.6 g/dL — ABNORMAL LOW (ref 13.0–17.0)
MCH: 32.5 pg (ref 26.0–34.0)
MCHC: 33.7 g/dL (ref 30.0–36.0)
MCV: 96.4 fL (ref 80.0–100.0)
Platelets: 239 10*3/uL (ref 150–400)
RBC: 3.88 MIL/uL — ABNORMAL LOW (ref 4.22–5.81)
RDW: 12.8 % (ref 11.5–15.5)
WBC: 7.6 10*3/uL (ref 4.0–10.5)
nRBC: 0 % (ref 0.0–0.2)

## 2023-07-25 LAB — BASIC METABOLIC PANEL
Anion gap: 10 (ref 5–15)
BUN: 19 mg/dL (ref 8–23)
CO2: 25 mmol/L (ref 22–32)
Calcium: 8.7 mg/dL — ABNORMAL LOW (ref 8.9–10.3)
Chloride: 100 mmol/L (ref 98–111)
Creatinine, Ser: 0.94 mg/dL (ref 0.61–1.24)
GFR, Estimated: >60 mL/min (ref >60)
Glucose, Bld: 204 mg/dL — ABNORMAL HIGH (ref 70–99)
Potassium: 3.3 mmol/L — ABNORMAL LOW (ref 3.5–5.1)
Sodium: 135 mmol/L (ref 135–145)

## 2023-07-25 MED ORDER — METHOCARBAMOL 500 MG PO TABS
500.0000 mg | ORAL_TABLET | Freq: Four times a day (QID) | ORAL | 2 refills | Status: DC | PRN
  Filled 2023-07-25: qty 40, 10d supply, fill #0
  Filled 2023-08-13 (×2): qty 40, 10d supply, fill #1

## 2023-07-25 MED ORDER — OXYCODONE HCL 5 MG PO TABS
5.0000 mg | ORAL_TABLET | ORAL | 0 refills | Status: AC | PRN
  Filled 2023-07-25: qty 42, 7d supply, fill #0

## 2023-07-25 MED ORDER — MELOXICAM 15 MG PO TABS
15.0000 mg | ORAL_TABLET | Freq: Every day | ORAL | 0 refills | Status: DC
  Filled 2023-07-25: qty 30, 30d supply, fill #0

## 2023-07-25 MED ORDER — SENNA 8.6 MG PO TABS
2.0000 | ORAL_TABLET | Freq: Every day | ORAL | 0 refills | Status: AC
  Filled 2023-07-25: qty 28, 14d supply, fill #0

## 2023-07-25 MED ORDER — POLYETHYLENE GLYCOL 3350 17 GM/SCOOP PO POWD
17.0000 g | Freq: Two times a day (BID) | ORAL | 0 refills | Status: AC
  Filled 2023-07-25: qty 238, 7d supply, fill #0

## 2023-07-25 MED ORDER — POTASSIUM CHLORIDE CRYS ER 20 MEQ PO TBCR
40.0000 meq | EXTENDED_RELEASE_TABLET | Freq: Once | ORAL | Status: AC
  Administered 2023-07-25: 40 meq via ORAL
  Filled 2023-07-25: qty 2

## 2023-07-25 NOTE — Progress Notes (Signed)
 Physical Therapy Treatment Patient Details Name: George Rojas MRN: [id number] DOB: 06/30/1956 Today's Date: 07/25/2023   History of Present Illness 68 y.o. male admitted 07/24/23 for L TKA. PMH: L temporal lobe removal, short term memory deficits, duodenal cancer, HTN    PT Comments  POD # 1 am session General Comments: AxO x 3 pleasant and motivated.  Required a few repeat instructions. Pt was OOB in bathroom with NT.  Assisted out of bathroom to amb in hallway.  General Gait Details: 50% VC's on proper walker to self distance and safety with turns.  Tolerated an increased distance. Practiced stairs.  General stair comments: First practiced ONE step up forward with walker.  Then pt stated he was going to a friends house that 6 steps.  Practiced up/down 3 steps using B rails.  Pt did well. Returned to room in recliner.   Replaced ICE and instructed on use of ICE MAN Pt will need another PT session to Educate/perform TE's.     If plan is discharge home, recommend the following: A little help with walking and/or transfers;Help with stairs or ramp for entrance;A little help with bathing/dressing/bathroom;Assistance with cooking/housework;Assist for transportation   Can travel by private vehicle        Equipment Recommendations       Recommendations for Other Services       Precautions / Restrictions Precautions Precautions: Fall;Knee Precaution Comments: no pillow under knee Restrictions Weight Bearing Restrictions Per Provider Order: No Other Position/Activity Restrictions: WBAT     Mobility  Bed Mobility               General bed mobility comments: OOB in bathroom with NT    Transfers Overall transfer level: Needs assistance Equipment used: Rolling walker (2 wheels) Transfers: Sit to/from Stand Sit to Stand: Supervision, Contact guard assist           General transfer comment: cues for hand placement and LLE position as well as safety with turns.     Ambulation/Gait Ambulation/Gait assistance: Supervision, Contact guard assist Gait Distance (Feet): 73 Feet Assistive device: Rolling walker (2 wheels) Gait Pattern/deviations: Step-to pattern Gait velocity: decreased     General Gait Details: 50% VC's on proper walker to self distance and safety with turns.  Tolerated an increased distance.   Stairs Stairs: Yes Stairs assistance: Contact guard assist, Supervision Stair Management: No rails, Two rails, Forwards Number of Stairs: 4 General stair comments: First practiced ONE step up forward with walker.  Then pt stated he was going to a friends house that 6 steps.  Practiced up/down 3 steps using B rails.  Pt did well.   Wheelchair Mobility     Tilt Bed    Modified Rankin (Stroke Patients Only)       Balance                                            Cognition     Overall Cognitive Status: Within Functional Limits for tasks assessed                                 General Comments: AxO x 3 pleasant and motivated.  Required a few repeat instructions.        Exercises      General Comments  Pertinent Vitals/Pain Pain Assessment Pain Assessment: 0-10 Pain Score: 6  Pain Location: left knee Pain Descriptors / Indicators: Discomfort, Aching, Operative site guarding Pain Intervention(s): Monitored during session, Premedicated before session, Repositioned, Ice applied    Home Living                          Prior Function            PT Goals (current goals can now be found in the care plan section) Progress towards PT goals: Progressing toward goals    Frequency    7X/week      PT Plan      Co-evaluation              AM-PAC PT 6 Clicks Mobility   Outcome Measure  Help needed turning from your back to your side while in a flat bed without using bedrails?: None Help needed moving from lying on your back to sitting on the side of a  flat bed without using bedrails?: None Help needed moving to and from a bed to a chair (including a wheelchair)?: None Help needed standing up from a chair using your arms (e.g., wheelchair or bedside chair)?: A Little Help needed to walk in hospital room?: A Little Help needed climbing 3-5 steps with a railing? : A Little 6 Click Score: 21    End of Session Equipment Utilized During Treatment: Gait belt Activity Tolerance: Patient tolerated treatment well Patient left: with call bell/phone within reach;in chair;with chair alarm set Nurse Communication: Mobility status       Time: 9149-9084 PT Time Calculation (min) (ACUTE ONLY): 25 min  Charges:    $Gait Training: 8-22 mins $Therapeutic Activity: 8-22 mins PT General Charges $$ ACUTE PT VISIT: 1 Visit                    Katheryn Leap  PTA Acute  Rehabilitation Services Office M-F          [phone]

## 2023-07-25 NOTE — Progress Notes (Signed)
 Physical Therapy Treatment Patient Details Name: George Rojas MRN: [id number] DOB: Dec 11, 1955 Today's Date: 07/25/2023   History of Present Illness 68 y.o. male admitted 07/24/23 for L TKA. PMH: L temporal lobe removal, short term memory deficits, duodenal cancer, HTN    PT Comments  POd # 1 pm session Pt OOB in recliner.  Assisted with amb in hallway.  General Gait Details: < 25% VC's on proper walker use.  Tolerated an increased diatnce.  Also c/o increased pain 8/10.  Pain meds requested. Session shortened by lunch tray arrival and need for pain meds.  Will see pt again later for HEP Education then expect D/C to friends home later today.     If plan is discharge home, recommend the following: A little help with walking and/or transfers;Help with stairs or ramp for entrance;A little help with bathing/dressing/bathroom;Assistance with cooking/housework;Assist for transportation   Can travel by private vehicle        Equipment Recommendations  None recommended by PT    Recommendations for Other Services       Precautions / Restrictions Precautions Precautions: Fall;Knee Precaution Comments: no pillow under knee Restrictions Weight Bearing Restrictions Per Provider Order: No Other Position/Activity Restrictions: WBAT     Mobility  Bed Mobility               General bed mobility comments: OOB in recliner    Transfers Overall transfer level: Needs assistance Equipment used: Rolling walker (2 wheels) Transfers: Sit to/from Stand Sit to Stand: Supervision, Contact guard assist           General transfer comment: cues for hand placement and LLE position as well as safety with turns.    Ambulation/Gait Ambulation/Gait assistance: Supervision, Contact guard assist Gait Distance (Feet): 85 Feet Assistive device: Rolling walker (2 wheels) Gait Pattern/deviations: Step-to pattern Gait velocity: decreased     General Gait Details: < 25% VC's on proper walker  use.  Tolerated an increased diatnce.  Also c/o increased pain 8/10.  Pain meds requested.   Stairs Wheelchair Mobility     Tilt Bed    Modified Rankin (Stroke Patients Only)       Balance                                            Cognition Arousal: Alert Behavior During Therapy: WFL for tasks assessed/performed Overall Cognitive Status: Within Functional Limits for tasks assessed                                 General Comments: AxO x 3 pleasant and motivated.  Required a few repeat instructions.        Exercises      General Comments        Pertinent Vitals/Pain Pain Assessment Pain Assessment: 0-10 Pain Score: 8  Pain Location: left knee Pain Descriptors / Indicators: Discomfort, Aching, Operative site guarding Pain Intervention(s): Patient requesting pain meds-RN notified, Ice applied    Home Living                          Prior Function            PT Goals (current goals can now be found in the care plan section) Progress towards PT goals: Progressing toward goals  Frequency    7X/week      PT Plan      Co-evaluation              AM-PAC PT 6 Clicks Mobility   Outcome Measure  Help needed turning from your back to your side while in a flat bed without using bedrails?: None Help needed moving from lying on your back to sitting on the side of a flat bed without using bedrails?: None Help needed moving to and from a bed to a chair (including a wheelchair)?: None Help needed standing up from a chair using your arms (e.g., wheelchair or bedside chair)?: A Little Help needed to walk in hospital room?: A Little Help needed climbing 3-5 steps with a railing? : A Little 6 Click Score: 21    End of Session Equipment Utilized During Treatment: Gait belt Activity Tolerance: Patient tolerated treatment well Patient left: in chair;with call bell/phone within reach;with chair alarm set Nurse  Communication: Mobility status PT Visit Diagnosis: Other abnormalities of gait and mobility (R26.89);Difficulty in walking, not elsewhere classified (R26.2)     Time: 1207-1220 PT Time Calculation (min) (ACUTE ONLY): 13 min  Charges:    $Gait Training: 8-22 mins PT General Charges $$ ACUTE PT VISIT: 1 Visit                    Katheryn Leap  PTA Acute  Rehabilitation Services Office M-F          [phone]

## 2023-07-25 NOTE — Progress Notes (Signed)
 Physical Therapy Treatment Patient Details Name: George Rojas MRN: [id number] DOB: 01-06-1956 Today's Date: 07/25/2023   History of Present Illness 68 y.o. male admitted 07/24/23 for L TKA. PMH: L temporal lobe removal, short term memory deficits, duodenal cancer, HTN    PT Comments  POD # 1 pm session (part 2) Pt was premedicated and performed all supine TKR TE's following HEP handout sheet.   Pt staying an extra night due to pain control.  Will see in am.   If plan is discharge home, recommend the following: A little help with walking and/or transfers;Help with stairs or ramp for entrance;A little help with bathing/dressing/bathroom;Assistance with cooking/housework;Assist for transportation   Can travel by private vehicle        Equipment Recommendations  None recommended by PT    Recommendations for Other Services       Precautions / Restrictions Precautions Precautions: Fall;Knee Precaution Comments: no pillow under knee Restrictions Weight Bearing Restrictions Per Provider Order: No Other Position/Activity Restrictions: WBAT        Balance                                            Cognition Arousal: Alert Behavior During Therapy: WFL for tasks assessed/performed Overall Cognitive Status: Within Functional Limits for tasks assessed                                 General Comments: AxO x 3 pleasant and motivated.  Required a few repeat instructions.        Exercises   Total Knee Replacement TE's following HEP handout 10 reps B LE ankle pumps 05 reps towel squeezes 05 reps knee presses 05 reps heel slides  05 reps SAQ's 05 reps SLR's 05 reps ABD Educated on use of gait belt to assist with TE's Followed by ICE    General Comments        Pertinent Vitals/Pain Pain Assessment Pain Assessment: 0-10 Pain Score: 8  Pain Location: left knee Pain Descriptors / Indicators: Discomfort, Aching, Operative site  guarding Pain Intervention(s): Patient requesting pain meds-RN notified, Ice applied    Home Living                          Prior Function            PT Goals (current goals can now be found in the care plan section) Progress towards PT goals: Progressing toward goals    Frequency    7X/week      PT Plan      Co-evaluation              AM-PAC PT 6 Clicks Mobility   Outcome Measure  Help needed turning from your back to your side while in a flat bed without using bedrails?: None Help needed moving from lying on your back to sitting on the side of a flat bed without using bedrails?: None Help needed moving to and from a bed to a chair (including a wheelchair)?: None Help needed standing up from a chair using your arms (e.g., wheelchair or bedside chair)?: A Little Help needed to walk in hospital room?: A Little Help needed climbing 3-5 steps with a railing? : A Little 6 Click Score: 21    End of  Session Equipment Utilized During Treatment: Gait belt Activity Tolerance: Patient tolerated treatment well Patient left: in chair;with call bell/phone within reach;with chair alarm set Nurse Communication: Mobility status PT Visit Diagnosis: Other abnormalities of gait and mobility (R26.89);Difficulty in walking, not elsewhere classified (R26.2)     Time: 1320-1340 PT Time Calculation (min) (ACUTE ONLY): 20 min  Charges:     $Therapeutic Exercise: 8-22 mins  PT General Charges $$ ACUTE PT VISIT: 1 Visit                     Katheryn Leap  PTA Acute  Rehabilitation Services Office M-F          [phone]

## 2023-07-25 NOTE — Care Management Obs Status (Signed)
 MEDICARE OBSERVATION STATUS NOTIFICATION   Patient Details  Name: George Rojas MRN: [id number] Date of Birth: 1956-07-23   Medicare Observation Status Notification Given:  Yes    Halford Chessman 07/25/2023, 12:18 PM

## 2023-07-25 NOTE — Progress Notes (Addendum)
 Patient ID: George Rojas, male   DOB: [birth year], 68 y.o.   MRN: [id number] Subjective: 1 Day Post-Op Procedure(s) (LRB): TOTAL KNEE ARTHROPLASTY (Left)    Patient reports pain as moderate. No events.  Realized pain when trying to move the knee  Objective:   VITALS:   Vitals:   07/25/23 0554 07/25/23 0803  BP: 120/82 124/79  Pulse: 77 78  Resp: 16   Temp: (!) 97.5 F (36.4 C)   SpO2: 98%     Neurovascular intact Incision: dressing C/D/I  LABS Recent Labs    07/25/23 0341  HGB 12.6*  HCT 37.4*  WBC 7.6  PLT 239    Recent Labs    07/25/23 0341  NA 135  K 3.3*  BUN 19  CREATININE 0.94  GLUCOSE 204*    No results for input(s): LABPT, INR in the last 72 hours.   Assessment/Plan: 1 Day Post-Op Procedure(s) (LRB): TOTAL KNEE ARTHROPLASTY (Left)   Advance diet Up with therapy Home today after therapy RTC in 2 weeks Goals reviewed Replace potassium today

## 2023-07-26 ENCOUNTER — Encounter (HOSPITAL_COMMUNITY): Payer: Self-pay | Admitting: Orthopedic Surgery

## 2023-07-26 ENCOUNTER — Other Ambulatory Visit (HOSPITAL_COMMUNITY): Payer: Self-pay

## 2023-07-26 DIAGNOSIS — M1712 Unilateral primary osteoarthritis, left knee: Secondary | ICD-10-CM | POA: Diagnosis not present

## 2023-07-26 LAB — CBC
HCT: 32.8 % — ABNORMAL LOW (ref 39.0–52.0)
Hemoglobin: 11.4 g/dL — ABNORMAL LOW (ref 13.0–17.0)
MCH: 33.3 pg (ref 26.0–34.0)
MCHC: 34.8 g/dL (ref 30.0–36.0)
MCV: 95.9 fL (ref 80.0–100.0)
Platelets: 212 10*3/uL (ref 150–400)
RBC: 3.42 MIL/uL — ABNORMAL LOW (ref 4.22–5.81)
RDW: 13 % (ref 11.5–15.5)
WBC: 7.4 10*3/uL (ref 4.0–10.5)
nRBC: 0 % (ref 0.0–0.2)

## 2023-07-26 LAB — GLUCOSE, CAPILLARY: Glucose-Capillary: 137 mg/dL — ABNORMAL HIGH (ref 70–99)

## 2023-07-26 NOTE — Progress Notes (Signed)
 Physical Therapy Treatment Patient Details Name: George Rojas MRN: [id number] DOB: [birth year] Today's Date: 07/26/2023   History of Present Illness 68 y.o. male admitted 07/24/23 for L TKA. PMH: L temporal lobe removal, short term memory deficits, duodenal cancer, HTN    PT Comments  Pt progressing well, meeting PT goals. Reviewed and and reinforced mobility. Safety/precautions as below. Pt is ready to d/c with friend assisting as needed. Pt to have f/u HHPT    If plan is discharge home, recommend the following: A little help with walking and/or transfers;Help with stairs or ramp for entrance;A little help with bathing/dressing/bathroom;Assistance with cooking/housework;Assist for transportation   Can travel by private vehicle        Equipment Recommendations  None recommended by PT    Recommendations for Other Services       Precautions / Restrictions Precautions Precautions: Fall;Knee Precaution Comments: no pillow under knee Restrictions Weight Bearing Restrictions Per Provider Order: No Other Position/Activity Restrictions: WBAT     Mobility  Bed Mobility Overal bed mobility: Needs Assistance Bed Mobility: Supine to Sit     Supine to sit: Supervision, HOB elevated     General bed mobility comments: for safety,incdr time, use of leg lifter    Transfers Overall transfer level: Needs assistance Equipment used: Rolling walker (2 wheels) Transfers: Sit to/from Stand Sit to Stand: Supervision           General transfer comment: cues for hand placement and LLE position    Ambulation/Gait Ambulation/Gait assistance: Supervision, Contact guard assist Gait Distance (Feet): 100 Feet Assistive device: Rolling walker (2 wheels) Gait Pattern/deviations: Step-to pattern Gait velocity: decreased     General Gait Details: cues for RW position and sequence   Stairs Stairs: Yes Stairs assistance: Contact guard assist, Supervision Stair Management: Sideways,  Step to pattern, One rail Right, One rail Left Number of Stairs: 4 General stair comments: cues for sequence and technique. no LOB, no knee buckling   Wheelchair Mobility     Tilt Bed    Modified Rankin (Stroke Patients Only)       Balance                                            Cognition Arousal: Alert Behavior During Therapy: WFL for tasks assessed/performed Overall Cognitive Status: Within Functional Limits for tasks assessed                                 General Comments: some baseline STM deficits, repetitious cues, TKA HEP reviewed/written instructions        Exercises Total Joint Exercises Ankle Circles/Pumps: AROM, Both, 10 reps Quad Sets: AROM, Both, 5 reps Heel Slides: AAROM, Left, 5 reps Straight Leg Raises: AAROM, Left, 5 reps    General Comments        Pertinent Vitals/Pain Pain Assessment Pain Assessment: 0-10 Pain Score: 6  Pain Location: left knee Pain Descriptors / Indicators: Discomfort, Aching, Operative site guarding Pain Intervention(s): Limited activity within patient's tolerance, Monitored during session, Repositioned, Premedicated before session, Ice applied    Home Living                          Prior Function            PT Goals (current  goals can now be found in the care plan section) Acute Rehab PT Goals PT Goal Formulation: With patient Time For Goal Achievement: 07/31/23 Potential to Achieve Goals: Good Progress towards PT goals: Progressing toward goals    Frequency    7X/week      PT Plan      Co-evaluation              AM-PAC PT 6 Clicks Mobility   Outcome Measure  Help needed turning from your back to your side while in a flat bed without using bedrails?: None Help needed moving from lying on your back to sitting on the side of a flat bed without using bedrails?: None Help needed moving to and from a bed to a chair (including a wheelchair)?: A  Little Help needed standing up from a chair using your arms (e.g., wheelchair or bedside chair)?: A Little Help needed to walk in hospital room?: A Little Help needed climbing 3-5 steps with a railing? : A Little 6 Click Score: 20    End of Session Equipment Utilized During Treatment: Gait belt Activity Tolerance: Patient tolerated treatment well Patient left: in chair;with call bell/phone within reach;with chair alarm set Nurse Communication: Mobility status PT Visit Diagnosis: Other abnormalities of gait and mobility (R26.89);Difficulty in walking, not elsewhere classified (R26.2)     Time: 1020-1100 PT Time Calculation (min) (ACUTE ONLY): 40 min  Charges:    $Gait Training: 23-37 mins $Therapeutic Exercise: 8-22 mins PT General Charges $$ ACUTE PT VISIT: 1 Visit                     Zane Pellecchia, PT  Acute Rehab Dept [facility]) [phone]  07/26/2023    [facility] 07/26/2023, 11:07 AM

## 2023-07-26 NOTE — TOC Progression Note (Signed)
 Transition of Care [facility]) - Progression Note   Patient Details  Name: George Rojas MRN: [id number] Date of Birth: [birth year]  [facility] of [facility]) CM/SW Contact  Duwaine GORMAN Aran, LCSW Phone Number: 07/26/2023, 9:01 AM  Clinical Narrative: HHPT orders placed. CSW notified Cindie with [facility].  Barriers to Discharge: No Barriers Identified  Expected Discharge Plan and Services Expected Discharge Date: 07/26/23               DME Arranged: N/A DME Agency: NA HH Arranged: PT HH Agency: [facility] Date [facility] Agency Contacted: 07/24/23 Time HH Agency Contacted: 1501 Representative spoke with at [facility] Agency: Cindie  Social Determinants of Health (SDOH) Interventions SDOH Screenings   Food Insecurity: No Food Insecurity (07/24/2023)  Housing: Unknown (07/24/2023)  Transportation Needs: No Transportation Needs (07/24/2023)  Utilities: Not At Risk (07/24/2023)  Depression (PHQ2-9): Low Risk  (08/01/2018)  Social Connections: Unknown (07/24/2023)  Tobacco Use: Unknown (07/24/2023)   Readmission Risk Interventions     No data to display

## 2023-07-26 NOTE — Progress Notes (Signed)
   Subjective: 2 Days Post-Op Procedure(s) (LRB): TOTAL KNEE ARTHROPLASTY (Left) Patient reports pain as mild.   Patient seen in rounds for Dr. Ernie. Patient is well, and has had no acute complaints or problems. No acute events overnight. He did have severe spasms in his leg yesterday, which were improved with medication. He was limited with PT yesterday due to pain. We will continue therapy today.   Objective: Vital signs in last 24 hours: Temp:  [97.6 F (36.4 C)-97.8 F (36.6 C)] 97.8 F (36.6 C) (01/02 0506) Pulse Rate:  [65-79] 73 (01/02 0506) Resp:  [16-17] 16 (01/02 0506) BP: (114-144)/(66-87) 122/81 (01/02 0506) SpO2:  [96 %-100 %] 96 % (01/02 0506)  Intake/Output from previous day:  Intake/Output Summary (Last 24 hours) at 07/26/2023 0914 Last data filed at 07/26/2023 9167 Gross per 24 hour  Intake 1410 ml  Output 550 ml  Net 860 ml     Intake/Output this shift: Total I/O In: 240 [P.O.:240] Out: -   Labs: Recent Labs    07/25/23 0341 07/26/23 0403  HGB 12.6* 11.4*   Recent Labs    07/25/23 0341 07/26/23 0403  WBC 7.6 7.4  RBC 3.88* 3.42*  HCT 37.4* 32.8*  PLT 239 212   Recent Labs    07/25/23 0341  NA 135  K 3.3*  CL 100  CO2 25  BUN 19  CREATININE 0.94  GLUCOSE 204*  CALCIUM  8.7*   No results for input(s): LABPT, INR in the last 72 hours.  Exam: General - Patient is Alert and Oriented Extremity - Neurologically intact Sensation intact distally Intact pulses distally Dorsiflexion/Plantar flexion intact Dressing - dressing C/D/I Motor Function - intact, moving foot and toes well on exam.   Past Medical History:  Diagnosis Date   Arthritis    Asthma    Cancer (HCC)    duodenal   Diabetes mellitus without complication (HCC)    Hypertension    Hypothyroidism    Seizures (HCC)     Assessment/Plan: 2 Days Post-Op Procedure(s) (LRB): TOTAL KNEE ARTHROPLASTY (Left) Principal Problem:   S/P total knee arthroplasty,  left  Estimated body mass index is 31.52 kg/m as calculated from the following:   Height as of this encounter: 5' 11 (1.803 m).   Weight as of this encounter: 102.5 kg. Advance diet Up with therapy D/C IV fluids  Anticipated LOS equal to or greater than 2 midnights due to - Age 66 and older with one or more of the following:  - Obesity  - Expected need for hospital services (PT, OT, Nursing) required for safe  discharge  - Anticipated need for postoperative skilled nursing care or inpatient rehab  - Active co-morbidities: Diabetes OR   - Unanticipated findings during/Post Surgery: Slow post-op progression: GI, pain control, mobility  - Patient is a high risk of re-admission due to: None   DVT Prophylaxis -  Plavix  Weight bearing as tolerated.  Patient was not able to meet their goals with PT yesterday for safe discharge home related to pain control.  He has a history of brain surgery which also limits his mobility to a degree.   Plan is to go Home after hospital stay. Plan for discharge today following 1-2 sessions of PT as long as they are meeting their goals. Patient is scheduled for OPPT. Follow up in the office in 2 weeks.   Rosina Calin, PA-C Orthopedic Surgery [phone] 07/26/2023, 9:14 AM

## 2023-08-03 NOTE — Discharge Summary (Signed)
 Patient ID: JED KUTCH MRN: [id number] DOB/AGE: 30-May-1956 68 y.o.  Admit date: 07/24/2023 Discharge date: 07/26/2023  Admission Diagnoses:  Left knee osteoarthritis  Discharge Diagnoses:  Principal Problem:   S/P total knee arthroplasty, left   Past Medical History:  Diagnosis Date   Arthritis    Asthma    Cancer (HCC)    duodenal   Diabetes mellitus without complication (HCC)    Hypertension    Hypothyroidism    Seizures (HCC)     Surgeries: Procedure(s): TOTAL KNEE ARTHROPLASTY on 07/24/2023   Consultants:   Discharged Condition: Improved  Hospital Course: YURIY CUI is an 68 y.o. male who was admitted 07/24/2023 for operative treatment ofS/P total knee arthroplasty, left. Patient has severe unremitting pain that affects sleep, daily activities, and work/hobbies. After pre-op clearance the patient was taken to the operating room on 07/24/2023 and underwent  Procedure(s): TOTAL KNEE ARTHROPLASTY.    Patient was given perioperative antibiotics:  Anti-infectives (From admission, onward)    Start     Dose/Rate Route Frequency Ordered Stop   07/24/23 1730  ceFAZolin  (ANCEF ) IVPB 2g/100 mL premix        2 g 200 mL/hr over 30 Minutes Intravenous Every 6 hours 07/24/23 1430 07/25/23 1046   07/24/23 0930  ceFAZolin  (ANCEF ) IVPB 2g/100 mL premix        2 g 200 mL/hr over 30 Minutes Intravenous On call to O.R. 07/24/23 0920 07/24/23 1118        Patient was given sequential compression devices, early ambulation, and chemoprophylaxis to prevent DVT. Patient worked with PT and was meeting their goals regarding safe ambulation and transfers.  Patient benefited maximally from hospital stay and there were no complications.    Recent vital signs: No data found.   Recent laboratory studies: No results for input(s): WBC, HGB, HCT, PLT, NA, K, CL, CO2, BUN, CREATININE, GLUCOSE, INR, CALCIUM  in the last 72 hours.  Invalid input(s): PT,  2   Discharge Medications:   Allergies as of 07/26/2023       Reactions   Phenobarbital Other (See Comments)   Increases seizure activity.         Medication List     STOP taking these medications    diclofenac 75 MG EC tablet Commonly known as: VOLTAREN       TAKE these medications    albuterol  108 (90 Base) MCG/ACT inhaler Commonly known as: VENTOLIN  HFA Inhale 1-2 puffs into the lungs every 6 (six) hours as needed for wheezing or shortness of breath.   atorvastatin  80 MG tablet Commonly known as: LIPITOR Take 80 mg by mouth in the morning.   carbamazepine  200 MG tablet Commonly known as: TEGRETOL  Take 200 mg by mouth 2 (two) times daily.   CINNAMON PO Take 1,000 mg by mouth in the morning.   clopidogrel  75 MG tablet Commonly known as: PLAVIX  Take 75 mg by mouth at bedtime.   enalapril  20 MG tablet Commonly known as: VASOTEC  Take 20 mg by mouth daily with breakfast.   gabapentin  300 MG capsule Commonly known as: NEURONTIN  Take 300 mg by mouth at bedtime.   glipiZIDE  5 MG tablet Commonly known as: GLUCOTROL  Take 5 mg by mouth at bedtime.   hydrochlorothiazide  25 MG tablet Commonly known as: HYDRODIURIL  Take 25 mg by mouth daily with breakfast.   lamoTRIgine  150 MG tablet Commonly known as: LAMICTAL  Take 150 mg by mouth 2 (two) times daily.   levothyroxine  100 MCG tablet Commonly known as:  SYNTHROID  Take 100 mcg by mouth daily before breakfast.   meloxicam  15 MG tablet Commonly known as: MOBIC  Take 1 tablet (15 mg total) by mouth daily.   methocarbamol  500 MG tablet Commonly known as: ROBAXIN  Take 1 tablet (500 mg total) by mouth every 6 (six) hours as needed for muscle spasms (muscle pain).   metoprolol  succinate 50 MG 24 hr tablet Commonly known as: TOPROL -XL Take 50 mg by mouth in the morning.   oxyCODONE  5 MG immediate release tablet Commonly known as: Oxy IR/ROXICODONE  Take 1 tablet (5 mg total) by mouth every 4 (four) hours as  needed for severe pain (pain score 7-10). Notes to patient: Last given at 1025   polyethylene glycol powder 17 GM/SCOOP powder Commonly known as: GLYCOLAX /MIRALAX  Take 17 grams by mouth 2 (two) times daily.   rOPINIRole  0.5 MG tablet Commonly known as: REQUIP  Take 0.5 mg by mouth at bedtime.   senna 8.6 MG Tabs tablet Commonly known as: SENOKOT Take 2 tablets (17.2 mg total) by mouth at bedtime for 14 days.   sertraline  25 MG tablet Commonly known as: ZOLOFT  Take 25 mg by mouth in the morning.   terazosin  1 MG capsule Commonly known as: HYTRIN  Take 1 mg by mouth at bedtime.               Discharge Care Instructions  (From admission, onward)           Start     Ordered   07/25/23 0000  Change dressing       Comments: Maintain surgical dressing until follow up in the clinic. If the edges start to pull up, may reinforce with tape. If the dressing is no longer working, may remove and cover with gauze and tape, but must keep the area dry and clean.  Call with any questions or concerns.   07/25/23 1044            Diagnostic Studies: No results found.  Disposition: Discharge disposition: 01-Home or Self Care       Discharge Instructions     Call MD / Call 911   Complete by: As directed    If you experience chest pain or shortness of breath, CALL 911 and be transported to the hospital emergency room.  If you develope a fever above 101 F, pus (white drainage) or increased drainage or redness at the wound, or calf pain, call your surgeon's office.   Change dressing   Complete by: As directed    Maintain surgical dressing until follow up in the clinic. If the edges start to pull up, may reinforce with tape. If the dressing is no longer working, may remove and cover with gauze and tape, but must keep the area dry and clean.  Call with any questions or concerns.   Constipation Prevention   Complete by: As directed    Drink plenty of fluids.  Prune juice may be  helpful.  You may use a stool softener, such as Colace (over the counter) 100 mg twice a day.  Use MiraLax  (over the counter) for constipation as needed.   Diet - low sodium heart healthy   Complete by: As directed    Increase activity slowly as tolerated   Complete by: As directed    Weight bearing as tolerated with assist device (walker, cane, etc) as directed, use it as long as suggested by your surgeon or therapist, typically at least 4-6 weeks.   Post-operative opioid taper instructions:   Complete  by: As directed    POST-OPERATIVE OPIOID TAPER INSTRUCTIONS: It is important to wean off of your opioid medication as soon as possible. If you do not need pain medication after your surgery it is ok to stop day one. Opioids include: Codeine, Hydrocodone(Norco, Vicodin), Oxycodone (Percocet, oxycontin ) and hydromorphone  amongst others.  Long term and even short term use of opiods can cause: Increased pain response Dependence Constipation Depression Respiratory depression And more.  Withdrawal symptoms can include Flu like symptoms Nausea, vomiting And more Techniques to manage these symptoms Hydrate well Eat regular healthy meals Stay active Use relaxation techniques(deep breathing, meditating, yoga) Do Not substitute Alcohol to help with tapering If you have been on opioids for less than two weeks and do not have pain than it is ok to stop all together.  Plan to wean off of opioids This plan should start within one week post op of your joint replacement. Maintain the same interval or time between taking each dose and first decrease the dose.  Cut the total daily intake of opioids by one tablet each day Next start to increase the time between doses. The last dose that should be eliminated is the evening dose.      TED hose   Complete by: As directed    Use stockings (TED hose) for 2 weeks on both leg(s).  You may remove them at night for sleeping.        Follow-up  Information     Ernie Cough, MD. Schedule an appointment as soon as possible for a visit in 2 week(s).   Specialty: Orthopedic Surgery Contact information: [address] [place] [address] [phone]         Care, [facility] Follow up.   Specialty: Home Health Services Why: to provide home health physical therapy Contact information: [address] [place] [address] [phone]                  Signed: Rosina JONELLE Calin 08/03/2023, 12:43 PM

## 2023-08-09 ENCOUNTER — Other Ambulatory Visit (HOSPITAL_COMMUNITY): Payer: Self-pay

## 2023-08-13 ENCOUNTER — Other Ambulatory Visit (HOSPITAL_BASED_OUTPATIENT_CLINIC_OR_DEPARTMENT_OTHER): Payer: Self-pay

## 2023-08-13 ENCOUNTER — Other Ambulatory Visit: Payer: Self-pay

## 2023-08-13 ENCOUNTER — Other Ambulatory Visit (HOSPITAL_COMMUNITY): Payer: Self-pay | Admitting: Student

## 2023-08-13 ENCOUNTER — Other Ambulatory Visit (HOSPITAL_COMMUNITY): Payer: Self-pay

## 2023-08-14 ENCOUNTER — Other Ambulatory Visit (HOSPITAL_COMMUNITY): Payer: Self-pay

## 2023-08-28 ENCOUNTER — Other Ambulatory Visit (HOSPITAL_COMMUNITY): Payer: Self-pay

## 2023-08-28 MED ORDER — MELOXICAM 15 MG PO TABS
15.0000 mg | ORAL_TABLET | Freq: Every day | ORAL | 0 refills | Status: AC
  Filled 2023-08-28: qty 30, 30d supply, fill #0

## 2023-08-29 ENCOUNTER — Other Ambulatory Visit (HOSPITAL_COMMUNITY): Payer: Self-pay

## 2023-08-29 MED ORDER — METHOCARBAMOL 500 MG PO TABS
500.0000 mg | ORAL_TABLET | Freq: Four times a day (QID) | ORAL | 1 refills | Status: DC | PRN
  Filled 2023-08-29 – 2023-08-30 (×2): qty 40, 10d supply, fill #0
  Filled 2023-09-19 – 2023-09-20 (×2): qty 40, 10d supply, fill #1

## 2023-08-30 ENCOUNTER — Other Ambulatory Visit (HOSPITAL_COMMUNITY): Payer: Self-pay

## 2023-08-30 ENCOUNTER — Other Ambulatory Visit: Payer: Self-pay

## 2023-09-04 ENCOUNTER — Other Ambulatory Visit: Payer: Self-pay

## 2023-09-04 ENCOUNTER — Other Ambulatory Visit (HOSPITAL_BASED_OUTPATIENT_CLINIC_OR_DEPARTMENT_OTHER): Payer: Self-pay

## 2023-09-04 ENCOUNTER — Emergency Department (HOSPITAL_BASED_OUTPATIENT_CLINIC_OR_DEPARTMENT_OTHER)
Admission: EM | Admit: 2023-09-04 | Discharge: 2023-09-04 | Disposition: A | Discharge status: Home / Self Care | Payer: Medicare HMO | Attending: Emergency Medicine | Admitting: Emergency Medicine

## 2023-09-04 ENCOUNTER — Encounter (HOSPITAL_BASED_OUTPATIENT_CLINIC_OR_DEPARTMENT_OTHER): Payer: Self-pay | Admitting: Emergency Medicine

## 2023-09-04 DIAGNOSIS — Z7902 Long term (current) use of antithrombotics/antiplatelets: Secondary | ICD-10-CM | POA: Insufficient documentation

## 2023-09-04 DIAGNOSIS — R059 Cough, unspecified: Secondary | ICD-10-CM | POA: Diagnosis present

## 2023-09-04 DIAGNOSIS — J101 Influenza due to other identified influenza virus with other respiratory manifestations: Secondary | ICD-10-CM | POA: Diagnosis not present

## 2023-09-04 LAB — RESP PANEL BY RT-PCR (RSV, FLU A&B, COVID)  RVPGX2
Influenza A by PCR: POSITIVE — AB
Influenza B by PCR: NEGATIVE
Resp Syncytial Virus by PCR: NEGATIVE
SARS Coronavirus 2 by RT PCR: NEGATIVE

## 2023-09-04 MED ORDER — OSELTAMIVIR PHOSPHATE 75 MG PO CAPS
75.0000 mg | ORAL_CAPSULE | Freq: Two times a day (BID) | ORAL | 0 refills | Status: AC
  Filled 2023-09-04: qty 10, 5d supply, fill #0

## 2023-09-04 MED ORDER — GUAIFENESIN-DM 100-10 MG/5ML PO SYRP
5.0000 mL | ORAL_SOLUTION | ORAL | 0 refills | Status: AC | PRN
  Filled 2023-09-04: qty 118, 4d supply, fill #0

## 2023-09-04 MED ORDER — BENZONATATE 100 MG PO CAPS
100.0000 mg | ORAL_CAPSULE | Freq: Three times a day (TID) | ORAL | 0 refills | Status: AC
  Filled 2023-09-04: qty 21, 7d supply, fill #0

## 2023-09-04 NOTE — ED Provider Notes (Signed)
[facility] AT [facility] Provider Note   CSN: [id number] Arrival date & time: 09/04/23  1102     History  No chief complaint on file.   George Rojas is a 68 y.o. male.  Patient presents to the ED with flu-ile symptoms, onset yesterday. Cough, chest congestion. Non-productive cough. Denies fefver  The history is provided by the patient. No language interpreter was used.  Influenza Presenting symptoms: cough and myalgias   Presenting symptoms: no fever   Severity:  Moderate Onset quality:  Sudden Duration:  1 day      Home Medications Prior to Admission medications   Medication Sig Start Date End Date Taking? Authorizing Provider  albuterol (PROVENTIL HFA;VENTOLIN HFA) 108 (90 BASE) MCG/ACT inhaler Inhale 1-2 puffs into the lungs every 6 (six) hours as needed for wheezing or shortness of breath. 04/27/15   Palumbo, April, MD  atorvastatin (LIPITOR) 80 MG tablet Take 80 mg by mouth in the morning.    [provider]  carbamazepine (TEGRETOL) 200 MG tablet Take 200 mg by mouth 2 (two) times daily.     [provider]  CINNAMON PO Take 1,000 mg by mouth in the morning.    [provider]  clopidogrel (PLAVIX) 75 MG tablet Take 75 mg by mouth at bedtime.    [provider]  enalapril (VASOTEC) 20 MG tablet Take 20 mg by mouth daily with breakfast.     [provider]  gabapentin (NEURONTIN) 300 MG capsule Take 300 mg by mouth at bedtime. 04/06/23   [provider]  glipiZIDE (GLUCOTROL) 5 MG tablet Take 5 mg by mouth at bedtime. 04/06/23   [provider]  hydrochlorothiazide (HYDRODIURIL) 25 MG tablet Take 25 mg by mouth daily with breakfast.     [provider]  lamoTRIgine (LAMICTAL) 150 MG tablet Take 150 mg by mouth 2 (two) times daily.    [provider]  levothyroxine (SYNTHROID) 100 MCG tablet Take 100 mcg by mouth daily before breakfast. 04/06/23   [provider]  meloxicam (MOBIC) 15 MG tablet Take 1 tablet (15 mg total) by mouth daily. 08/28/23 09/27/23  Cassandria Anger, PA-C  methocarbamol (ROBAXIN) 500 MG tablet Take 1 tablet (500 mg total) by mouth every 6 (six) hours as needed for muscle pain. 07/25/23   Cassandria Anger, PA-C  metoprolol succinate (TOPROL-XL) 50 MG 24 hr tablet Take 50 mg by mouth in the morning. 04/06/23   [provider]  oxyCODONE (OXY IR/ROXICODONE) 5 MG immediate release tablet Take 1 tablet (5 mg total) by mouth every 4 (four) hours as needed for severe pain (pain score 7-10). 07/25/23   Cassandria Anger, PA-C  polyethylene glycol powder (GLYCOLAX/MIRALAX) 17 GM/SCOOP powder Take 17 grams by mouth 2 (two) times daily. 07/25/23   Cassandria Anger, PA-C  rOPINIRole (REQUIP) 0.5 MG tablet Take 0.5 mg by mouth at bedtime. 04/06/23   [provider]  sertraline (ZOLOFT) 25 MG tablet Take 25 mg by mouth in the morning. 04/06/23   [provider]  terazosin (HYTRIN) 1 MG capsule Take 1 mg by mouth at bedtime.    [provider]      Allergies    Phenobarbital    Review of Systems   Review of Systems  Constitutional:  Negative for fever.  Respiratory:  Positive for cough.   Musculoskeletal:  Positive for myalgias.  All other systems reviewed and are negative.   Physical Exam Updated  Vital Signs BP 123/79 (BP Location: Left Arm)   Pulse 97   Temp 98.1 F (36.7 C)   Resp 18   SpO2 94%  Physical Exam Constitutional:      Appearance: Normal appearance.  HENT:     Head: Normocephalic.     Nose: Nose normal.  Eyes:     Conjunctiva/sclera: Conjunctivae normal.  Cardiovascular:     Rate and Rhythm: Normal rate and regular rhythm.  Pulmonary:     Effort: Pulmonary effort is normal.     Breath sounds: Normal breath sounds.  Abdominal:     Palpations: Abdomen is soft.  Musculoskeletal:        General: Normal range of motion.     Cervical back: Normal range of motion and neck  supple.  Skin:    General: Skin is warm and dry.  Neurological:     Mental Status: He is alert and oriented to person, place, and time.  Psychiatric:        Mood and Affect: Mood normal.        Behavior: Behavior normal.     ED Results / Procedures / Treatments   Labs (all labs ordered are listed, but only abnormal results are displayed) Labs Reviewed  RESP PANEL BY RT-PCR (RSV, FLU A&B, COVID)  [id number] - Abnormal; Notable for the following components:      Result Value   Influenza A by PCR POSITIVE (*)    All other components within normal limits    EKG None  Radiology No results found.  Procedures Procedures    Medications Ordered in ED Medications - No data to display  ED Course/ Medical Decision Making/ A&P                                 Medical Decision Making Risk OTC drugs. Prescription drug management.   Patient with symptoms consistent with influenza.  Vitals are stable, low-grade fever.  No signs of dehydration, tolerating PO's.  Lungs are clear. Due to patient's presentation and physical exam a chest x-ray was not ordered bc likely diagnosis of flu.  Discussed the cost versus benefit of Tamiflu treatment with the patient.  Patient will be discharged with instructions to orally hydrate, rest, and use over-the-counter medications such as anti-inflammatories ibuprofen and Aleve for muscle aches and Tylenol for fever.  Patient will also be given a cough suppressant.          Final Clinical Impression(s) / ED Diagnoses Final diagnoses:  Influenza A    Rx / DC Orders ED Discharge Orders          Ordered    oseltamivir (TAMIFLU) 75 MG capsule  Every 12 hours        09/04/23 1334    benzonatate (TESSALON) 100 MG capsule  Every 8 hours        09/04/23 1334    guaiFENesin-dextromethorphan (ROBITUSSIN DM) 100-10 MG/5ML syrup  Every 4 hours PRN        09/04/23 1334              Felicie Morn, NP 09/04/23 1338    Tanda Rockers A, DO 09/06/23  0730

## 2023-09-04 NOTE — ED Triage Notes (Signed)
Flu like symptoms- cough chest congestion Started yesterday

## 2023-09-04 NOTE — Discharge Instructions (Addendum)
Please refer to the attached instructions. Robitussin every 4 hours as needed for cough. Tessalon every 12 hours. Tamiflu as directed. Tylenol and/or ibuprofen for body aches and pain.

## 2023-09-20 ENCOUNTER — Other Ambulatory Visit (HOSPITAL_COMMUNITY): Payer: Self-pay

## 2023-09-20 ENCOUNTER — Other Ambulatory Visit: Payer: Self-pay

## 2023-09-20 MED ORDER — METHOCARBAMOL 500 MG PO TABS
500.0000 mg | ORAL_TABLET | Freq: Four times a day (QID) | ORAL | 2 refills | Status: AC | PRN
  Filled 2023-10-01 – 2023-10-16 (×3): qty 40, 10d supply, fill #0

## 2023-09-20 MED ORDER — AMOXICILLIN 500 MG PO CAPS
2000.0000 mg | ORAL_CAPSULE | Freq: Once | ORAL | 0 refills | Status: AC
  Filled 2023-09-20: qty 4, 1d supply, fill #0

## 2023-09-24 ENCOUNTER — Other Ambulatory Visit (HOSPITAL_COMMUNITY): Payer: Self-pay

## 2023-09-25 ENCOUNTER — Other Ambulatory Visit (HOSPITAL_COMMUNITY): Payer: Self-pay

## 2023-10-01 ENCOUNTER — Other Ambulatory Visit (HOSPITAL_COMMUNITY): Payer: Self-pay

## 2023-10-16 ENCOUNTER — Other Ambulatory Visit (HOSPITAL_COMMUNITY): Payer: Self-pay

## 2023-12-15 ENCOUNTER — Other Ambulatory Visit: Payer: Self-pay

## 2023-12-15 ENCOUNTER — Encounter (HOSPITAL_BASED_OUTPATIENT_CLINIC_OR_DEPARTMENT_OTHER): Payer: Self-pay

## 2023-12-15 ENCOUNTER — Emergency Department (HOSPITAL_BASED_OUTPATIENT_CLINIC_OR_DEPARTMENT_OTHER)
Admission: EM | Admit: 2023-12-15 | Discharge: 2023-12-15 | Disposition: A | Discharge status: Home / Self Care | Attending: Emergency Medicine | Admitting: Emergency Medicine

## 2023-12-15 DIAGNOSIS — R21 Rash and other nonspecific skin eruption: Secondary | ICD-10-CM | POA: Diagnosis present

## 2023-12-15 DIAGNOSIS — Z7984 Long term (current) use of oral hypoglycemic drugs: Secondary | ICD-10-CM | POA: Insufficient documentation

## 2023-12-15 DIAGNOSIS — L259 Unspecified contact dermatitis, unspecified cause: Secondary | ICD-10-CM | POA: Insufficient documentation

## 2023-12-15 DIAGNOSIS — L309 Dermatitis, unspecified: Secondary | ICD-10-CM

## 2023-12-15 MED ORDER — PREDNISONE 50 MG PO TABS
60.0000 mg | ORAL_TABLET | Freq: Once | ORAL | Status: AC
  Administered 2023-12-15: 60 mg via ORAL
  Filled 2023-12-15: qty 1

## 2023-12-15 MED ORDER — DIPHENHYDRAMINE HCL 25 MG PO CAPS
25.0000 mg | ORAL_CAPSULE | Freq: Once | ORAL | Status: AC
  Administered 2023-12-15: 25 mg via ORAL
  Filled 2023-12-15: qty 1

## 2023-12-15 MED ORDER — PREDNISONE 10 MG (21) PO TBPK: ORAL_TABLET | ORAL | 0 refills | Status: AC

## 2023-12-15 NOTE — ED Provider Notes (Signed)
 [place] [facility] AT [facility]  Provider Note  CSN: [id number] Arrival date & time: 12/15/23 0113  History Chief Complaint  Patient presents with   Rash    George Rojas is a 68 y.o. male reports itching rash on groin and neck. He thinks it might be poison ivy. Some itching on hands, but less rash. He has not tried any oral or topical antihistamines, but has been using rubbing alcohol.    Home Medications Prior to Admission medications   Medication Sig Start Date End Date Taking? Authorizing Provider  predniSONE  (STERAPRED UNI-PAK 21 TAB) 10 MG (21) TBPK tablet 10mg  Tabs, 6 day taper. Use as directed 12/15/23  Yes Charmayne Cooper, MD  albuterol  (PROVENTIL  HFA;VENTOLIN  HFA) 108 (90 BASE) MCG/ACT inhaler Inhale 1-2 puffs into the lungs every 6 (six) hours as needed for wheezing or shortness of breath. 04/27/15   Palumbo, April, MD  atorvastatin  (LIPITOR) 80 MG tablet Take 80 mg by mouth in the morning.    [provider]  benzonatate  (TESSALON ) 100 MG capsule Take 1 capsule (100 mg total) by mouth every 8 (eight) hours. 09/04/23   Gigi Kyle, NP  carbamazepine  (TEGRETOL ) 200 MG tablet Take 200 mg by mouth 2 (two) times daily.     [provider]  CINNAMON PO Take 1,000 mg by mouth in the morning.    [provider]  clopidogrel  (PLAVIX ) 75 MG tablet Take 75 mg by mouth at bedtime.    [provider]  enalapril  (VASOTEC ) 20 MG tablet Take 20 mg by mouth daily with breakfast.     [provider]  gabapentin  (NEURONTIN ) 300 MG capsule Take 300 mg by mouth at bedtime. 04/06/23   [provider]  glipiZIDE  (GLUCOTROL ) 5 MG tablet Take 5 mg by mouth at bedtime. 04/06/23   [provider]  guaiFENesin -dextromethorphan  (ROBITUSSIN DM) 100-10 MG/5ML syrup Take 5 mLs by mouth every 4 (four) hours as needed for cough. 09/04/23   Gigi Kyle, NP  hydrochlorothiazide  (HYDRODIURIL ) 25 MG tablet Take 25 mg by  mouth daily with breakfast.     [provider]  lamoTRIgine  (LAMICTAL ) 150 MG tablet Take 150 mg by mouth 2 (two) times daily.    [provider]  levothyroxine  (SYNTHROID ) 100 MCG tablet Take 100 mcg by mouth daily before breakfast. 04/06/23   [provider]  methocarbamol  (ROBAXIN ) 500 MG tablet Take 1 tablet (500 mg total) by mouth every 6 (six) hours as needed for spasms. 09/20/23     metoprolol  succinate (TOPROL -XL) 50 MG 24 hr tablet Take 50 mg by mouth in the morning. 04/06/23   [provider]  oseltamivir  (TAMIFLU ) 75 MG capsule Take 1 capsule (75 mg total) by mouth every 12 (twelve) hours. 09/04/23   Gigi Kyle, NP  oxyCODONE  (OXY IR/ROXICODONE ) 5 MG immediate release tablet Take 1 tablet (5 mg total) by mouth every 4 (four) hours as needed for severe pain (pain score 7-10). 07/25/23   Earnie Gola, PA-C  polyethylene glycol powder (GLYCOLAX /MIRALAX ) 17 GM/SCOOP powder Take 17 grams by mouth 2 (two) times daily. 07/25/23   Earnie Gola, PA-C  rOPINIRole  (REQUIP ) 0.5 MG tablet Take 0.5 mg by mouth at bedtime. 04/06/23   [provider]  sertraline  (ZOLOFT ) 25 MG tablet Take 25 mg by mouth in the morning. 04/06/23   [provider]  terazosin  (HYTRIN ) 1 MG capsule Take 1 mg by mouth at bedtime.    [provider]  Allergies    Phenobarbital   Review of Systems   Review of Systems Please see HPI for pertinent positives and negatives  Physical Exam BP (!) 155/86   Pulse 75   Temp 98.2 F (36.8 C)   Resp 16   SpO2 98%   Physical Exam Vitals and nursing note reviewed.  HENT:     Head: Normocephalic.     Nose: Nose normal.  Eyes:     Extraocular Movements: Extraocular movements intact.  Pulmonary:     Effort: Pulmonary effort is normal.  Musculoskeletal:        General: Normal range of motion.     Cervical back: Neck supple.  Skin:    Findings: Rash (small areas of erythema, and raised nodules on neck  and scrotum) present.  Neurological:     Mental Status: He is alert and oriented to person, place, and time.  Psychiatric:        Mood and Affect: Mood normal.     ED Results / Procedures / Treatments   EKG None  Procedures Procedures  Medications Ordered in the ED Medications  diphenhydrAMINE  (BENADRYL ) capsule 25 mg (has no administration in time range)  predniSONE  (DELTASONE ) tablet 60 mg (has no administration in time range)    Initial Impression and Plan  Patient here with itching rash, appears to be either contact dermatitis vs chiggers. Will give benadryl , prednisone . Advised rubbing alcohol may be more irritating than helpful. PCP follow up, RTED for any other concerns.    ED Course       MDM Rules/Calculators/A&P Medical Decision Making Problems Addressed: Dermatitis: acute illness or injury  Risk Prescription drug management.     Final Clinical Impression(s) / ED Diagnoses Final diagnoses:  Dermatitis    Rx / DC Orders ED Discharge Orders          Ordered    predniSONE  (STERAPRED UNI-PAK 21 TAB) 10 MG (21) TBPK tablet        12/15/23 0122             Charmayne Cooper, MD 12/15/23 [phone]

## 2023-12-15 NOTE — ED Triage Notes (Signed)
 Pt c/o poison ivy to hands, "private areas," first noticed approx 3-4 days ago. States he's been using alcohol for symptoms

## 2024-11-11 ENCOUNTER — Ambulatory Visit (HOSPITAL_COMMUNITY)
Admission: RE | Admit: 2024-11-11 | Payer: Medicare (Managed Care) | Source: Ambulatory Visit | Admitting: Orthopedic Surgery

## 2024-11-11 ENCOUNTER — Encounter: Admission: RE | Payer: Self-pay | Source: Ambulatory Visit

## 2024-11-11 SURGERY — ARTHROPLASTY, KNEE, TOTAL
Anesthesia: Spinal | Site: Knee | Laterality: Right
